# Patient Record
Sex: Female | Born: 1960 | Race: Asian | Hispanic: No | Marital: Married | Smoking: Never smoker
Health system: Southern US, Community
[De-identification: ages and names within clinical notes are randomized; demographics above are authoritative.]

## PROBLEM LIST (undated history)

## (undated) DIAGNOSIS — F329 Major depressive disorder, single episode, unspecified: Secondary | ICD-10-CM

## (undated) DIAGNOSIS — C801 Malignant (primary) neoplasm, unspecified: Secondary | ICD-10-CM

## (undated) DIAGNOSIS — G4730 Sleep apnea, unspecified: Secondary | ICD-10-CM

## (undated) DIAGNOSIS — Z923 Personal history of irradiation: Secondary | ICD-10-CM

## (undated) DIAGNOSIS — F32A Depression, unspecified: Secondary | ICD-10-CM

## (undated) DIAGNOSIS — G4700 Insomnia, unspecified: Secondary | ICD-10-CM

## (undated) DIAGNOSIS — F419 Anxiety disorder, unspecified: Secondary | ICD-10-CM

## (undated) HISTORY — PX: APPENDECTOMY: SHX54

## (undated) HISTORY — PX: MANDIBULECTOMY: SHX1999

---

## 2016-01-16 DIAGNOSIS — Z923 Personal history of irradiation: Secondary | ICD-10-CM

## 2016-01-16 HISTORY — DX: Personal history of irradiation: Z92.3

## 2016-08-20 ENCOUNTER — Other Ambulatory Visit: Payer: Self-pay | Admitting: Family Medicine

## 2016-08-20 DIAGNOSIS — Z1231 Encounter for screening mammogram for malignant neoplasm of breast: Secondary | ICD-10-CM

## 2016-08-30 ENCOUNTER — Ambulatory Visit
Admission: RE | Admit: 2016-08-30 | Discharge: 2016-08-30 | Disposition: A | Discharge status: Home / Self Care | Payer: BC Managed Care – PPO | Source: Ambulatory Visit | Attending: Family Medicine | Admitting: Family Medicine

## 2016-08-30 DIAGNOSIS — Z1231 Encounter for screening mammogram for malignant neoplasm of breast: Secondary | ICD-10-CM

## 2016-09-15 DIAGNOSIS — C801 Malignant (primary) neoplasm, unspecified: Secondary | ICD-10-CM

## 2016-09-15 HISTORY — DX: Malignant (primary) neoplasm, unspecified: C80.1

## 2016-09-19 ENCOUNTER — Other Ambulatory Visit: Payer: Self-pay | Admitting: Family Medicine

## 2016-09-19 DIAGNOSIS — R928 Other abnormal and inconclusive findings on diagnostic imaging of breast: Secondary | ICD-10-CM

## 2016-09-24 ENCOUNTER — Other Ambulatory Visit: Payer: Self-pay | Admitting: Family Medicine

## 2016-09-24 ENCOUNTER — Ambulatory Visit
Admission: RE | Admit: 2016-09-24 | Discharge: 2016-09-24 | Disposition: A | Discharge status: Home / Self Care | Payer: BC Managed Care – PPO | Source: Ambulatory Visit | Attending: Family Medicine | Admitting: Family Medicine

## 2016-09-24 DIAGNOSIS — N6320 Unspecified lump in the left breast, unspecified quadrant: Secondary | ICD-10-CM

## 2016-09-24 DIAGNOSIS — R928 Other abnormal and inconclusive findings on diagnostic imaging of breast: Secondary | ICD-10-CM

## 2016-09-25 ENCOUNTER — Other Ambulatory Visit: Payer: Self-pay | Admitting: Family Medicine

## 2016-09-25 DIAGNOSIS — N6320 Unspecified lump in the left breast, unspecified quadrant: Secondary | ICD-10-CM

## 2016-09-26 ENCOUNTER — Ambulatory Visit
Admission: RE | Admit: 2016-09-26 | Discharge: 2016-09-26 | Disposition: A | Discharge status: Home / Self Care | Payer: BC Managed Care – PPO | Source: Ambulatory Visit | Attending: Family Medicine | Admitting: Family Medicine

## 2016-09-26 DIAGNOSIS — N6320 Unspecified lump in the left breast, unspecified quadrant: Secondary | ICD-10-CM

## 2016-09-26 HISTORY — PX: BREAST BIOPSY: SHX20

## 2016-09-27 ENCOUNTER — Telehealth: Payer: Self-pay | Admitting: Hematology and Oncology

## 2016-09-27 ENCOUNTER — Encounter: Payer: Self-pay | Admitting: *Deleted

## 2016-09-27 NOTE — Telephone Encounter (Signed)
Confirmed BC appointment on 10/03/16 for the afternoon, will email intake form to [email or web address]

## 2016-10-02 ENCOUNTER — Other Ambulatory Visit: Payer: Self-pay

## 2016-10-02 ENCOUNTER — Other Ambulatory Visit: Payer: Self-pay | Admitting: *Deleted

## 2016-10-02 DIAGNOSIS — C50212 Malignant neoplasm of upper-inner quadrant of left female breast: Secondary | ICD-10-CM | POA: Insufficient documentation

## 2016-10-02 DIAGNOSIS — Z170 Estrogen receptor positive status [ER+]: Principal | ICD-10-CM

## 2016-10-03 ENCOUNTER — Ambulatory Visit (HOSPITAL_BASED_OUTPATIENT_CLINIC_OR_DEPARTMENT_OTHER): Payer: BC Managed Care – PPO | Admitting: Hematology and Oncology

## 2016-10-03 ENCOUNTER — Encounter: Payer: Self-pay | Admitting: Physical Therapy

## 2016-10-03 ENCOUNTER — Encounter: Payer: Self-pay | Admitting: Hematology and Oncology

## 2016-10-03 ENCOUNTER — Ambulatory Visit: Payer: BC Managed Care – PPO | Attending: General Surgery | Admitting: Physical Therapy

## 2016-10-03 ENCOUNTER — Ambulatory Visit
Admission: RE | Admit: 2016-10-03 | Discharge: 2016-10-03 | Disposition: A | Discharge status: Home / Self Care | Payer: BC Managed Care – PPO | Source: Ambulatory Visit | Attending: Radiation Oncology | Admitting: Radiation Oncology

## 2016-10-03 ENCOUNTER — Other Ambulatory Visit: Payer: Self-pay | Admitting: General Surgery

## 2016-10-03 ENCOUNTER — Encounter: Payer: Self-pay | Admitting: Radiation Oncology

## 2016-10-03 ENCOUNTER — Other Ambulatory Visit (HOSPITAL_BASED_OUTPATIENT_CLINIC_OR_DEPARTMENT_OTHER): Payer: BC Managed Care – PPO

## 2016-10-03 ENCOUNTER — Encounter: Payer: Self-pay | Admitting: General Practice

## 2016-10-03 DIAGNOSIS — C50212 Malignant neoplasm of upper-inner quadrant of left female breast: Secondary | ICD-10-CM | POA: Diagnosis not present

## 2016-10-03 DIAGNOSIS — R293 Abnormal posture: Secondary | ICD-10-CM | POA: Diagnosis present

## 2016-10-03 DIAGNOSIS — Z800 Family history of malignant neoplasm of digestive organs: Secondary | ICD-10-CM | POA: Diagnosis not present

## 2016-10-03 DIAGNOSIS — Z801 Family history of malignant neoplasm of trachea, bronchus and lung: Secondary | ICD-10-CM | POA: Insufficient documentation

## 2016-10-03 DIAGNOSIS — Z170 Estrogen receptor positive status [ER+]: Principal | ICD-10-CM

## 2016-10-03 DIAGNOSIS — Z808 Family history of malignant neoplasm of other organs or systems: Secondary | ICD-10-CM

## 2016-10-03 DIAGNOSIS — Z9889 Other specified postprocedural states: Secondary | ICD-10-CM | POA: Insufficient documentation

## 2016-10-03 DIAGNOSIS — G4700 Insomnia, unspecified: Secondary | ICD-10-CM | POA: Insufficient documentation

## 2016-10-03 DIAGNOSIS — Z510 Encounter for antineoplastic radiation therapy: Secondary | ICD-10-CM | POA: Insufficient documentation

## 2016-10-03 DIAGNOSIS — Z79899 Other long term (current) drug therapy: Secondary | ICD-10-CM | POA: Insufficient documentation

## 2016-10-03 HISTORY — DX: Insomnia, unspecified: G47.00

## 2016-10-03 LAB — CBC WITH DIFFERENTIAL/PLATELET
BASO%: 0.8 % (ref 0.0–2.0)
BASOS ABS: 0.1 10*3/uL (ref 0.0–0.1)
EOS%: 5.7 % (ref 0.0–7.0)
Eosinophils Absolute: 0.4 10*3/uL (ref 0.0–0.5)
HCT: 42.4 % (ref 34.8–46.6)
HEMOGLOBIN: 13.6 g/dL (ref 11.6–15.9)
LYMPH#: 1.9 10*3/uL (ref 0.9–3.3)
LYMPH%: 29.6 % (ref 14.0–49.7)
MCH: 27.4 pg (ref 25.1–34.0)
MCHC: 32.1 g/dL (ref 31.5–36.0)
MCV: 85.5 fL (ref 79.5–101.0)
MONO#: 0.5 10*3/uL (ref 0.1–0.9)
MONO%: 8.1 % (ref 0.0–14.0)
NEUT#: 3.5 10*3/uL (ref 1.5–6.5)
NEUT%: 55.8 % (ref 38.4–76.8)
PLATELETS: 220 10*3/uL (ref 145–400)
RBC: 4.96 10*6/uL (ref 3.70–5.45)
RDW: 13.5 % (ref 11.2–14.5)
WBC: 6.3 10*3/uL (ref 3.9–10.3)

## 2016-10-03 LAB — COMPREHENSIVE METABOLIC PANEL
ALBUMIN: 4.1 g/dL (ref 3.5–5.0)
ALK PHOS: 94 U/L (ref 40–150)
ALT: 18 U/L (ref 0–55)
ANION GAP: 8 meq/L (ref 3–11)
AST: 25 U/L (ref 5–34)
BUN: 13.6 mg/dL (ref 7.0–26.0)
CALCIUM: 9.6 mg/dL (ref 8.4–10.4)
CHLORIDE: 104 meq/L (ref 98–109)
CO2: 28 mEq/L (ref 22–29)
CREATININE: 0.7 mg/dL (ref 0.6–1.1)
EGFR: >90 mL/min/{1.73_m2} (ref >90)
Glucose: 77 mg/dl (ref 70–140)
POTASSIUM: 4 meq/L (ref 3.5–5.1)
Sodium: 141 mEq/L (ref 136–145)
Total Bilirubin: 0.36 mg/dL (ref 0.20–1.20)
Total Protein: 7.6 g/dL (ref 6.4–8.3)

## 2016-10-03 NOTE — Progress Notes (Signed)
Radiation Oncology         [phone]) [id number] ________________________________  Name: Grace Cunningham        MRN: [id number]  Date of Service: 10/03/2016 DOB: 1960-05-28  [id number], Bill Salinas, MD  [name], MD     REFERRING PHYSICIAN: [name], MD   DIAGNOSIS: The encounter diagnosis was Malignant neoplasm of upper-inner quadrant of left breast in female, estrogen receptor positive ([place]).   HISTORY OF PRESENT ILLNESS: Grace Cunningham is a 56 y.o. female seen in the multidisciplinary breast clinic for a new diagnosis of left breast cancer. The patient was noted to have a screening distortion on mammography of the left breast. She underwent diagnostic imaging on 09/24/16 which revealed a 1.2 x 1 x  .6 cm at 9:30. The axilla is negative for adenopathy. 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 She comes today to discuss options of radiotherapy.   PREVIOUS RADIATION THERAPY: No   PAST MEDICAL HISTORY:  Past Medical History:  Diagnosis Date  . Insomnia        PAST SURGICAL HISTORY: Past Surgical History:  Procedure Laterality Date  . APPENDECTOMY    . MANDIBULECTOMY       FAMILY HISTORY:  Family History  Problem Relation Age of Onset  . Lung cancer Mother   . Skin cancer Maternal Grandmother   . Stomach cancer Maternal Grandfather   . Lung cancer Maternal Uncle      SOCIAL HISTORY:  reports that she has never smoked. She has never used smokeless tobacco. She reports that she does not drink alcohol or use drugs. The patient is married and lives in [place]. 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   ALLERGIES: Patient has no known allergies.   MEDICATIONS:  Current Outpatient Prescriptions  Medication Sig Dispense Refill  . Melatonin 1 MG CAPS Take 1 capsule by mouth 3 (three) times a week.    . Multiple  Vitamin (MULTIVITAMIN) tablet Take 1 tablet by mouth as needed.    . sertraline (ZOLOFT) 50 MG tablet Take 50 mg by mouth daily.     No current facility-administered medications for this encounter.      REVIEW OF SYSTEMS: On review of systems, the patient reports that she is doing well overall. She denies any chest pain, shortness of breath, cough, fevers, chills, night sweats, unintended weight changes. She denies any bowel or bladder disturbances, and denies abdominal pain, nausea or vomiting. She denies any new musculoskeletal or joint aches or pains. A complete review of systems is obtained and is otherwise negative.     PHYSICAL EXAM:  Wt Readings from Last 3 Encounters:  10/03/16 132 lb 8 oz (60.1 kg)   Temp Readings from Last 3 Encounters:  10/03/16 98.2 F (36.8 C) (Oral)   BP Readings from Last 3 Encounters:  10/03/16 122/81   Pulse Readings from Last 3 Encounters:  10/03/16 (!) 59    In general this is a well appearing Asian female in no acute distress. She is alert and oriented x4 and appropriate throughout the examination. HEENT reveals that the patient is normocephalic, atraumatic. EOMs are intact. PERRLA. Skin is intact without any evidence of gross lesions. Cardiovascular exam reveals a regular rate and rhythm, no clicks rubs or murmurs are auscultated. Chest is clear to auscultation bilaterally. Lymphatic assessment is performed  and does not reveal any adenopathy in the cervical, supraclavicular, axillary, or inguinal chains. Bilateral breast exam is performed and reveals mild ecchymosis. She does not have any palpable mass in the breast otherwise or left breast. She does not have any nipple bleeding or discharge. Abdomen has active bowel sounds in all quadrants and is intact. The abdomen is soft, non tender, non distended. Lower extremities are negative for pretibial pitting edema, deep calf tenderness, cyanosis or clubbing.   ECOG = 0  0 - Asymptomatic (Fully active,  able to carry on all predisease activities without restriction)  1 - Symptomatic but completely ambulatory (Restricted in physically strenuous activity but ambulatory and able to carry out work of a light or sedentary nature. For example, light housework, office work)  2 - Symptomatic, <50% in bed during the day (Ambulatory and capable of all self care but unable to carry out any work activities. Up and about more than 50% of waking hours)  3 - Symptomatic, >50% in bed, but not bedbound (Capable of only limited self-care, confined to bed or chair 50% or more of waking hours)  4 - Bedbound (Completely disabled. Cannot carry on any self-care. Totally confined to bed or chair)  5 - Death   Santiago Glad MM, Creech RH, Tormey DC, et al. [phone]). "Toxicity and response criteria of the [facility]". Am. Evlyn Clines. Oncol. 5 (6): 649-55    LABORATORY DATA:  Lab Results  Component Value Date   WBC 6.3 10/03/2016   HGB 13.6 10/03/2016   HCT 42.4 10/03/2016   MCV 85.5 10/03/2016   PLT 220 10/03/2016   Lab Results  Component Value Date   NA 141 10/03/2016   K 4.0 10/03/2016   CO2 28 10/03/2016   Lab Results  Component Value Date   ALT 18 10/03/2016   AST 25 10/03/2016   ALKPHOS 94 10/03/2016   BILITOT 0.36 10/03/2016      RADIOGRAPHY: US Breast Ltd Uni Left Inc Axilla  Result Date: 09/24/2016 CLINICAL DATA:  The patient was called back from screening mammography due to left breast distortion. EXAM: 2D DIGITAL DIAGNOSTIC LEFT MAMMOGRAM WITH ADJUNCT TOMO ULTRASOUND LEFT BREAST COMPARISON:  Previous exam(s). ACR Breast Density Category c: The breast tissue is heterogeneously dense, which may obscure small masses. FINDINGS: There is distortion in the left retroareolar region which persists on today's imaging. Calcifications in the left breast are stable for many years. On physical exam, no suspicious lumps are identified. Targeted ultrasound is performed, showing a suspicious  irregular mass at 9:30 in the retroareolar region measuring 6 x 12 x 10 mm. No axillary adenopathy. IMPRESSION: Suspicious mass in the left breast. RECOMMENDATION: Recommend ultrasound-guided biopsy of the suspicious left breast mass. If the mass represents malignancy, consider breast MRI to assess extent of disease. I have discussed the findings and recommendations with the patient. Results were also provided in writing at the conclusion of the visit. If applicable, a reminder letter will be sent to the patient regarding the next appointment. BI-RADS CATEGORY  5: Highly suggestive of malignancy. Electronically Signed   By: Gerome Sam III M.D   On: 09/24/2016 16:27   Mm Diag Breast Tomo Uni Left  Result Date: 09/24/2016 CLINICAL DATA:  The patient was called back from screening mammography due to left breast distortion. EXAM: 2D DIGITAL DIAGNOSTIC LEFT MAMMOGRAM WITH ADJUNCT TOMO ULTRASOUND LEFT BREAST COMPARISON:  Previous exam(s). ACR Breast Density Category c: The breast tissue is heterogeneously dense, which may obscure  small masses. FINDINGS: There is distortion in the left retroareolar region which persists on today's imaging. Calcifications in the left breast are stable for many years. On physical exam, no suspicious lumps are identified. Targeted ultrasound is performed, showing a suspicious irregular mass at 9:30 in the retroareolar region measuring 6 x 12 x 10 mm. No axillary adenopathy. IMPRESSION: Suspicious mass in the left breast. RECOMMENDATION: Recommend ultrasound-guided biopsy of the suspicious left breast mass. If the mass represents malignancy, consider breast MRI to assess extent of disease. I have discussed the findings and recommendations with the patient. Results were also provided in writing at the conclusion of the visit. If applicable, a reminder letter will be sent to the patient regarding the next appointment. BI-RADS CATEGORY  5: Highly suggestive of malignancy. Electronically  Signed   By: Dorise Bullion III M.D   On: 09/24/2016 16:27   Mm Clip Placement Left  Result Date: 09/26/2016 CLINICAL DATA:  56 year old female status post ultrasound-guided biopsy of a left breast mass. EXAM: DIAGNOSTIC LEFT MAMMOGRAM POST ULTRASOUND BIOPSY COMPARISON:  Previous exam(s). FINDINGS: Mammographic images were obtained following ultrasound guided biopsy of a left subareolar mass. Post clip films demonstrate a ribbon shaped clip in the expected location. IMPRESSION: Ribbon shaped clip in the expected location status post left subareolar ultrasound-guided biopsy. Final Assessment: Post Procedure Mammograms for Marker Placement Electronically Signed   By: Kristopher Oppenheim M.D.   On: 09/26/2016 09:33   [place] Lt Breast Bx W Loc Dev 1st Lesion Img Bx Spec US Guide  Addendum Date: 09/27/2016   ADDENDUM REPORT: 09/27/2016 15:45 ADDENDUM: Pathology revealed GRADE I INVASIVE DUCTAL CARCINOMA, DUCTAL CARCINOMA IN SITU of the Left breast, 9:30 o'clock subareolar. This was found to be concordant by Dr. Kristopher Oppenheim. Pathology results were discussed with the patient by telephone. The patient reported doing well after the biopsy with tenderness at the site. Post biopsy instructions and care were reviewed and questions were answered. The patient was encouraged to call [facility] for any additional concerns. The patient was referred to [facility] on October 03, 2016. Recommendation for bilateral breast MRI due to breast density and to evaluate extent of disease. Pathology results reported by Terie Purser, RN on 09/27/2016. Electronically Signed   By: Kristopher Oppenheim M.D.   On: 09/27/2016 15:45   Result Date: 09/27/2016 CLINICAL DATA:  56 year old female with suspicious left breast mass. EXAM: ULTRASOUND GUIDED LEFT BREAST CORE NEEDLE BIOPSY COMPARISON:  Previous exam(s). FINDINGS: I met with the patient and  we discussed the procedure of ultrasound-guided biopsy, including benefits and alternatives. We discussed the high likelihood of a successful procedure. We discussed the risks of the procedure, including infection, bleeding, tissue injury, clip migration, and inadequate sampling. Informed written consent was given. The usual time-out protocol was performed immediately prior to the procedure. Lesion quadrant: Upper-outer quadrant. Using sterile technique and 1% Lidocaine as local anesthetic, under direct ultrasound visualization, a 14 gauge spring-loaded device was used to perform biopsy of a left breast mass using a inferior approach. At the conclusion of the procedure a ribbon shaped tissue marker clip was deployed into the biopsy cavity. Follow up 2 view mammogram was performed and dictated separately. IMPRESSION: Ultrasound guided biopsy of a left breast mass. No apparent complications. Electronically Signed: By: Kristopher Oppenheim M.D. On: 09/26/2016 09:32    IMPRESSION/PLAN: 1. 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 Dr. Lisbeth Renshaw discusses the pathology findings and reviews the nature of invasive breast disease. The consensus from the breast conference include breast conservation with lumpectomy with sentinel mapping. Oncotype testing is anticipated to determine a role for chemotherapy. Provided that chemotherapy is not indicated, the patient's course would then be followed by external radiotherapy to the breast followed by antiestrogen therapy. We discussed the risks, benefits, short, and long term effects of radiotherapy, and the patient is interested in proceeding. Dr. Lisbeth Renshaw discusses the delivery and logistics of radiotherapy and would anticipate a course of 4- 6 1/2 weeks of treatment with deep inspiration breath hold technique, though leaning toward 6 1/2 weeks. We will see her back about 2 weeks after surgery to move forward with the simulation and  planning process and anticipate starting radiotherapy about 4 weeks after surgery.    The above documentation reflects my direct findings during this shared patient visit. Please see the separate note by Dr. Lisbeth Renshaw on this date for the remainder of the patient's plan of care.    Carola Rhine, PAC

## 2016-10-03 NOTE — Therapy (Signed)
[place], [place], [address] Phone: [phone]   Fax:  [phone]  Physical Therapy Evaluation  Patient Details  Name: Grace Cunningham MRN: [id number] Date of Birth: 1960/12/11 Referring Provider: Dr. [name]  Encounter Date: 10/03/2016      PT End of Session - 10/03/16 2020    Visit Number 1   Number of Visits 2   Date for PT Re-Evaluation --  Will reassess 3 weeks post op   PT Start Time 1558   PT Stop Time 1620   PT Time Calculation (min) 22 min   Activity Tolerance Patient tolerated treatment well   Behavior During Therapy [facility] for tasks assessed/performed      Past Medical History:  Diagnosis Date  . Insomnia     Past Surgical History:  Procedure Laterality Date  . APPENDECTOMY    . MANDIBULECTOMY      There were no vitals filed for this visit.       Subjective Assessment - 10/03/16 2013    Subjective Patient reports she is here today to be seen by her medical team for her newly diagnosed left breast cancer.   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 It measures 1.2 cm and is located in the upper inner quadrant with a Ki67 of 3%. She has no other health problems.   Patient Stated Goals Reduce lymphedema risk and learn post op shoulder ROM HEP   Currently in Pain? No/denies            [facility] PT Assessment - 10/03/16 0001      Assessment   Medical Diagnosis Left breast cancer   Referring Provider Dr. [name]   Onset Date/Surgical Date 08/30/16   Hand Dominance Right   Prior Therapy none     Precautions   Precautions Other (comment)   Precaution Comments active breast cancer     Restrictions   Weight Bearing Restrictions No     Balance Screen   Has the patient fallen in the past 6 months No   Has the patient had a decrease in  activity level because of a fear of falling?  No   Is the patient reluctant to leave their home because of a fear of falling?  No     Home Ecologist residence   Living Arrangements Spouse/significant other   Available Help at Discharge Family     Prior Function   Level of Independence Independent   Vocation Full time employment   Vocation Requirements teaches nursing at [employer] She walks daily > 30 min and swims 1-2x/week for > 30 min     Cognition   Overall Cognitive Status Within Functional Limits for tasks assessed     Posture/Postural Control   Posture/Postural Control Postural limitations   Postural Limitations Rounded Shoulders     ROM / Strength   AROM / PROM / Strength AROM;Strength     AROM   AROM Assessment Site Shoulder;Cervical   Right/Left Shoulder Right;Left   Right Shoulder Extension 60 Degrees   Right Shoulder Flexion 150 Degrees   Right Shoulder ABduction 174 Degrees   Right Shoulder Internal Rotation 80 Degrees   Right Shoulder External Rotation 85 Degrees   Left Shoulder Extension 60 Degrees   Left Shoulder Flexion 154 Degrees   Left Shoulder ABduction 164  Degrees   Left Shoulder Internal Rotation 79 Degrees   Left Shoulder External Rotation 90 Degrees   Cervical Flexion WNL   Cervical Extension WNL   Cervical - Right Side Bend 25% limited   Cervical - Left Side Bend 25% limited   Cervical - Right Rotation WNL   Cervical - Left Rotation WNL     Strength   Overall Strength Within functional limits for tasks performed           LYMPHEDEMA/ONCOLOGY QUESTIONNAIRE - 10/03/16 2019      Type   Cancer Type Left breast cancer     Lymphedema Assessments   Lymphedema Assessments Upper extremities     Right Upper Extremity Lymphedema   10 cm Proximal to Olecranon Process 27.3 cm   Olecranon Process 22.2 cm   10 cm Proximal to Ulnar Styloid Process 18.6 cm   Just Proximal to Ulnar Styloid Process 13.5 cm    Across Hand at [facility] 18 cm   At [place] of 2nd Digit 5.9 cm     Left Upper Extremity Lymphedema   10 cm Proximal to Olecranon Process 25.8 cm   Olecranon Process 22.3 cm   10 cm Proximal to Ulnar Styloid Process 18.9 cm   Just Proximal to Ulnar Styloid Process 13.1 cm   Across Hand at [facility] 17.7 cm   At [place] of 2nd Digit 5.4 cm         Objective measurements completed on examination: See above findings.     Patient was instructed today in a home exercise program today for post op shoulder range of motion. These included active assist shoulder flexion in sitting, scapular retraction, wall walking with shoulder abduction, and hands behind head external rotation.  She was encouraged to do these twice a day, holding 3 seconds and repeating 5 times when permitted by her physician.         PT Education - 10/03/16 2020    Education provided Yes   Education Details Lymphedema risk reduction and post op shoulder ROM HEP   Person(s) Educated Patient;Spouse   Methods Explanation;Demonstration;Handout   Comprehension Verbalized understanding;Returned demonstration              Breast Clinic Goals - 10/03/16 2024      Patient will be able to verbalize understanding of pertinent lymphedema risk reduction practices relevant to her diagnosis specifically related to skin care.   Time 1   Period Days   Status Achieved     Patient will be able to return demonstrate and/or verbalize understanding of the post-op home exercise program related to regaining shoulder range of motion.   Time 1   Period Days   Status Achieved     Patient will be able to verbalize understanding of the importance of attending the postoperative After Breast Cancer Class for further lymphedema risk reduction education and therapeutic exercise.   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 It measures 1.2 cm and is located in the upper inner quadrant with a Ki67 of 3%. She has no other health problems. Her multidisciplinary medical team met prior to her assessments to determine a recommended treatment plan. She will benefit from post op PT to regain shoulder ROM  and reduce lymphedema risk.   History and Personal Factors relevant to plan of care: None   Clinical Presentation Stable   Clinical Presentation due to: Low risk breast cancer; otherwise healthy   Clinical Decision Making Low   Rehab Potential Excellent   Clinical Impairments Affecting Rehab Potential None   PT Frequency --  2 visits   PT Treatment/Interventions Therapeutic exercise;Patient/family education   PT Next Visit Plan Will f/u 3 weeks post op to determine PT needs and reassess   PT Home Exercise Plan Post op shoulder ROM HEP   Consulted and Agree with Plan of Care Patient;Family member/caregiver   Family Member Consulted Husband      Patient will benefit from skilled therapeutic intervention in order to improve the following deficits and impairments:  Postural dysfunction, Decreased knowledge of precautions, Pain, Impaired UE functional use, Decreased range of motion  Visit Diagnosis: Carcinoma of upper-inner quadrant of left breast in female, estrogen receptor positive ([place]) - Plan: PT plan of care cert/re-cert  Abnormal posture - Plan: PT plan of care cert/re-cert   Patient will follow up at outpatient cancer rehab if needed following surgery.  If the patient requires physical therapy at that time, a specific plan will be dictated and sent to the referring physician for approval. The patient was educated today on appropriate basic range of motion exercises to begin post operatively and the importance of attending the After Breast Cancer class following surgery.  Patient was educated today on lymphedema risk reduction practices as  it pertains to recommendations that will benefit the patient immediately following surgery.  She verbalized good understanding.  No additional physical therapy is indicated at this time.     Problem List Patient Active Problem List   Diagnosis Date Noted  . Malignant neoplasm of upper-inner quadrant of left breast in female, estrogen receptor positive ([place]) 10/02/2016   Annia Friendly, PT 10/03/16 8:27 PM  [place], [place], [address] Phone: [phone]   Fax:  [phone]  Name: Grace Cunningham MRN: [id number] Date of Birth: [birth year]

## 2016-10-03 NOTE — Progress Notes (Signed)
Nutrition Assessment  Reason for Assessment:  Pt seen in Breast Clinic  ASSESSMENT:   56 year old female with new diagnosis of breast cancer.  Past medical history of reviewed  Patient reports normal appetite  Medications:  reviewed  Labs: reviewed  Anthropometrics:   Height: 64 inches Weight: 132 lb BMI: 22   NUTRITION DIAGNOSIS: Food and nutrition related knowledge deficit related to new diagnosis of breast cancer as evidenced by no prior need for nutrition related information.  INTERVENTION:   Discussed and provided packet of information regarding nutritional tips for breast cancer patients.  Questions answered.  Teachback method used.  Contact information provided and patient knows to contact me with questions/concerns.    MONITORING, EVALUATION, and GOAL: Pt will consume a healthy plant based diet to maintain lean body mass throughout treatment.   Grace Cunningham, [place], [place] Registered Dietitian [phone] (pager)

## 2016-10-03 NOTE — Patient Instructions (Signed)

## 2016-10-04 ENCOUNTER — Other Ambulatory Visit: Payer: Self-pay | Admitting: General Surgery

## 2016-10-04 ENCOUNTER — Encounter: Payer: Self-pay | Admitting: Hematology and Oncology

## 2016-10-04 DIAGNOSIS — C50212 Malignant neoplasm of upper-inner quadrant of left female breast: Secondary | ICD-10-CM

## 2016-10-04 DIAGNOSIS — Z170 Estrogen receptor positive status [ER+]: Principal | ICD-10-CM

## 2016-10-04 NOTE — Progress Notes (Signed)
[place] Psychosocial Distress Screening Spiritual Care  Met with Grace Cunningham and husband Grace Cunningham in [facility] to introduce [facility] team/resources, reviewing distress screen per protocol.  The patient scored a 1 on the Psychosocial Distress Thermometer which indicates mild distress. Also assessed for distress and other psychosocial needs.   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  She and her husband have deep respect and reverence for one another, which is a huge source of strength and meaning for them.  [name] looks at her dx as an opportunity to develop more empathy and understanding, reporting very little distress.  They utilized our encounter for spiritual reflection and request further Spiritual Care.   Follow up needed: Yes.  We plan to keep in touch by phone/in person when the [name] are on campus.   [place], [place], [facility] Pager [phone] Voicemail [phone]

## 2016-10-04 NOTE — Assessment & Plan Note (Signed)
09/26/2016: Screening detected distortion in the left breast retroareolar region at 9:30 position 1.2 cm, axilla negative. 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 After reviewing the pathology in detail, we proceeded to discuss the different treatment options between surgery, radiation, chemotherapy, antiestrogen therapies.  Recommendations: 1. Breast conserving surgery with SLN biopsy followed by 2. Oncotype DX testing to determine if chemotherapy would be of any benefit followed by 3. Adjuvant radiation therapy followed by 4. Adjuvant antiestrogen therapy  Oncotype counseling: I discussed Oncotype DX test. I explained to the patient that this is a 21 gene panel to evaluate patient tumors DNA to calculate recurrence score. This would help determine whether patient has high risk or intermediate risk or low risk breast cancer. She understands that if her tumor was found to be high risk, she would benefit from systemic chemotherapy. If low risk, no need of chemotherapy. If she was found to be intermediate risk, we would need to evaluate the score as well as other risk factors and determine if an abbreviated chemotherapy may be of benefit.  Return to clinic after surgery to discuss final pathology report and then determine if Oncotype DX testing will need to be sent.

## 2016-10-04 NOTE — Progress Notes (Signed)
[facility] CONSULT NOTE  Patient Care Team: Rankins, Fanny Dance, MD as PCP - General (Family Medicine) Almond Lint, MD as Consulting Physician (General Surgery) Serena Croissant, MD as Consulting Physician (Hematology and Oncology) Dorothy Puffer, MD as Consulting Physician (Radiation Oncology)  CHIEF COMPLAINTS/PURPOSE OF CONSULTATION:  Newly diagnosed breast cancer  HISTORY OF PRESENTING ILLNESS:  Grace Cunningham 56 y.o. female is here because of recent diagnosis of left breast cancer. Patient had a screening mammogram that detected distortion in the left breast in the retroareolar region at 9:30 position measuring 1.2 cm in size. 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 She was presented this morning in the multidisciplinary tumor board and she is here today accompanied by her husband to discuss the treatment plan. Patient works as a Airline pilot at [employer] originally was from [place].  I reviewed her records extensively and collaborated the history with the patient.  SUMMARY OF ONCOLOGIC HISTORY:   Malignant neoplasm of upper-inner quadrant of left breast in female, estrogen receptor positive ([facility]CC)   09/26/2016 Initial Diagnosis    Screening detected distortion in the left breast retroareolar region at 9:30 position 1.2 cm, axilla negative. 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 Insomnia     SURGICAL HISTORY: Past Surgical History:  Procedure Laterality Date  . APPENDECTOMY    . MANDIBULECTOMY      SOCIAL HISTORY: Social History   Social History  . Marital status: Unknown    Spouse name: N/A  . Number of children: N/A  . Years of education: N/A   Occupational History  . Not on file.    Social History Main Topics  . Smoking status: Never Smoker  . Smokeless tobacco: Never Used  . Alcohol use No  . Drug use: No  . Sexual activity: Not on file   Other Topics Concern  . Not on file   Social History Narrative  . No narrative on file    FAMILY HISTORY: Family History  Problem Relation Age of Onset  . Lung cancer Mother   . Skin cancer Maternal Grandmother   . Stomach cancer Maternal Grandfather   . Lung cancer Maternal Uncle     ALLERGIES:  has No Known Allergies.  MEDICATIONS:  Current Outpatient Prescriptions  Medication Sig Dispense Refill  . Melatonin 1 MG CAPS Take 1 capsule by mouth 3 (three) times a week.    . Multiple Vitamin (MULTIVITAMIN) tablet Take 1 tablet by mouth as needed.    . sertraline (ZOLOFT) 50 MG tablet Take 50 mg by mouth daily.     No current facility-administered medications for this visit.     REVIEW OF SYSTEMS:   Constitutional: Denies fevers, chills or abnormal night sweats Eyes: Denies blurriness of vision, double vision or watery eyes Ears, nose, mouth, throat, and face: Denies mucositis or sore throat Respiratory: Denies cough, dyspnea or wheezes Cardiovascular: Denies palpitation, chest discomfort or lower extremity swelling Gastrointestinal:  Denies nausea, heartburn or change in bowel habits Skin: Denies abnormal skin rashes Lymphatics: Denies new lymphadenopathy or easy bruising Neurological:Denies numbness, tingling or new weaknesses Behavioral/Psych: Mood is stable, no new changes  Breast:  Denies any palpable lumps or discharge All other  systems were reviewed with the patient and are negative.  PHYSICAL EXAMINATION: ECOG PERFORMANCE STATUS: 0 - Asymptomatic  Vitals:   10/03/16 1314  BP: 122/81  Pulse: (!) 59  Resp: 18  Temp: 98.2 F (36.8 C)  SpO2: 98%   Filed Weights   10/03/16 1314  Weight: 132 lb 8 oz (60.1 kg)    GENERAL:alert, no distress and comfortable SKIN: skin color, texture, turgor  are normal, no rashes or significant lesions EYES: normal, conjunctiva are pink and non-injected, sclera clear OROPHARYNX:no exudate, no erythema and lips, buccal mucosa, and tongue normal  NECK: supple, thyroid normal size, non-tender, without nodularity LYMPH:  no palpable lymphadenopathy in the cervical, axillary or inguinal LUNGS: clear to auscultation and percussion with normal breathing effort HEART: regular rate & rhythm and no murmurs and no lower extremity edema ABDOMEN:abdomen soft, non-tender and normal bowel sounds Musculoskeletal:no cyanosis of digits and no clubbing  PSYCH: alert & oriented x 3 with fluent speech NEURO: no focal motor/sensory deficits BREAST: No palpable nodules in breast. No palpable axillary or supraclavicular lymphadenopathy (exam performed in the presence of a chaperone)   LABORATORY DATA:  I have reviewed the data as listed Lab Results  Component Value Date   WBC 6.3 10/03/2016   HGB 13.6 10/03/2016   HCT 42.4 10/03/2016   MCV 85.5 10/03/2016   PLT 220 10/03/2016   Lab Results  Component Value Date   NA 141 10/03/2016   K 4.0 10/03/2016   CO2 28 10/03/2016    RADIOGRAPHIC STUDIES: I have personally reviewed the radiological reports and agreed with the findings in the report.  ASSESSMENT AND PLAN:  Malignant neoplasm of upper-inner quadrant of left breast in female, estrogen receptor positive ([place]) 09/26/2016: Screening detected distortion in the left breast retroareolar region at 9:30 position 1.2 cm, axilla negative. 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 After reviewing the pathology in detail, we proceeded to discuss the different treatment options between surgery, radiation, chemotherapy, antiestrogen therapies.  Recommendations: 1. Breast conserving surgery with SLN biopsy followed by 2. Oncotype DX testing to determine if chemotherapy would be of any benefit followed by 3. Adjuvant radiation therapy followed by 4. Adjuvant antiestrogen therapy  Oncotype counseling: I discussed Oncotype DX test. I explained to the patient that this is a 21 gene panel to evaluate patient tumors DNA to calculate recurrence score. This would help determine whether patient has high risk or intermediate risk or low risk breast cancer. She understands that if her tumor was found to be high risk, she would benefit from systemic chemotherapy. If low risk, no need of chemotherapy. If she was found to be intermediate risk, we would need to evaluate the score as well as other risk factors and determine if an abbreviated chemotherapy may be of benefit.  Return to clinic after surgery to discuss final pathology report and then determine if Oncotype DX testing will need to be sent.  All questions were answered. The patient knows to call the clinic with any problems, questions or concerns.    Rulon Eisenmenger, MD 10/04/16

## 2016-10-05 ENCOUNTER — Encounter: Payer: Self-pay | Admitting: Hematology and Oncology

## 2016-10-08 ENCOUNTER — Telehealth: Payer: Self-pay | Admitting: Hematology and Oncology

## 2016-10-08 NOTE — Telephone Encounter (Signed)
This number is not in service. Scheduled appts per 9/21 sch msg. Sending her a confirmation letter

## 2016-10-09 ENCOUNTER — Encounter (HOSPITAL_BASED_OUTPATIENT_CLINIC_OR_DEPARTMENT_OTHER): Payer: Self-pay | Admitting: *Deleted

## 2016-10-09 ENCOUNTER — Telehealth: Payer: Self-pay | Admitting: *Deleted

## 2016-10-09 NOTE — Telephone Encounter (Signed)
Spoke with pt regarding. [facility] from 9.19.18. Denies questions or concerns regarding dx or treatment care plan. Encourage pt to call with needs. Received verbal understanding.

## 2016-10-11 ENCOUNTER — Ambulatory Visit
Admission: RE | Admit: 2016-10-11 | Discharge: 2016-10-11 | Disposition: A | Discharge status: Home / Self Care | Payer: BC Managed Care – PPO | Source: Ambulatory Visit | Attending: General Surgery | Admitting: General Surgery

## 2016-10-11 DIAGNOSIS — C50212 Malignant neoplasm of upper-inner quadrant of left female breast: Secondary | ICD-10-CM

## 2016-10-11 DIAGNOSIS — Z170 Estrogen receptor positive status [ER+]: Principal | ICD-10-CM

## 2016-10-11 NOTE — Progress Notes (Signed)
Pt given Ensure drink and instructed to drink by 0400 day of surgery with teach back method.

## 2016-10-15 ENCOUNTER — Encounter (HOSPITAL_BASED_OUTPATIENT_CLINIC_OR_DEPARTMENT_OTHER)
Admission: RE | Disposition: A | Discharge status: Home / Self Care | Payer: Self-pay | Source: Ambulatory Visit | Attending: General Surgery

## 2016-10-15 ENCOUNTER — Ambulatory Visit (HOSPITAL_BASED_OUTPATIENT_CLINIC_OR_DEPARTMENT_OTHER): Payer: BC Managed Care – PPO | Admitting: Anesthesiology

## 2016-10-15 ENCOUNTER — Encounter (HOSPITAL_BASED_OUTPATIENT_CLINIC_OR_DEPARTMENT_OTHER): Payer: Self-pay

## 2016-10-15 ENCOUNTER — Ambulatory Visit
Admission: RE | Admit: 2016-10-15 | Discharge: 2016-10-15 | Disposition: A | Discharge status: Home / Self Care | Payer: BC Managed Care – PPO | Source: Ambulatory Visit | Attending: General Surgery | Admitting: General Surgery

## 2016-10-15 ENCOUNTER — Ambulatory Visit (HOSPITAL_COMMUNITY)
Admission: RE | Admit: 2016-10-15 | Discharge: 2016-10-15 | Disposition: A | Discharge status: Home / Self Care | Payer: BC Managed Care – PPO | Source: Ambulatory Visit | Attending: General Surgery | Admitting: General Surgery

## 2016-10-15 ENCOUNTER — Ambulatory Visit (HOSPITAL_BASED_OUTPATIENT_CLINIC_OR_DEPARTMENT_OTHER)
Admission: RE | Admit: 2016-10-15 | Discharge: 2016-10-15 | Disposition: A | Discharge status: Home / Self Care | Payer: BC Managed Care – PPO | Source: Ambulatory Visit | Attending: General Surgery | Admitting: General Surgery

## 2016-10-15 DIAGNOSIS — F329 Major depressive disorder, single episode, unspecified: Secondary | ICD-10-CM | POA: Diagnosis not present

## 2016-10-15 DIAGNOSIS — Z8249 Family history of ischemic heart disease and other diseases of the circulatory system: Secondary | ICD-10-CM | POA: Diagnosis not present

## 2016-10-15 DIAGNOSIS — I10 Essential (primary) hypertension: Secondary | ICD-10-CM | POA: Diagnosis not present

## 2016-10-15 DIAGNOSIS — Z170 Estrogen receptor positive status [ER+]: Secondary | ICD-10-CM | POA: Diagnosis not present

## 2016-10-15 DIAGNOSIS — C50212 Malignant neoplasm of upper-inner quadrant of left female breast: Secondary | ICD-10-CM

## 2016-10-15 DIAGNOSIS — Z79899 Other long term (current) drug therapy: Secondary | ICD-10-CM | POA: Diagnosis not present

## 2016-10-15 DIAGNOSIS — G4730 Sleep apnea, unspecified: Secondary | ICD-10-CM | POA: Insufficient documentation

## 2016-10-15 HISTORY — DX: Anxiety disorder, unspecified: F41.9

## 2016-10-15 HISTORY — DX: Major depressive disorder, single episode, unspecified: F32.9

## 2016-10-15 HISTORY — DX: Depression, unspecified: F32.A

## 2016-10-15 HISTORY — DX: Malignant (primary) neoplasm, unspecified: C80.1

## 2016-10-15 HISTORY — DX: Sleep apnea, unspecified: G47.30

## 2016-10-15 HISTORY — PX: BREAST LUMPECTOMY WITH RADIOACTIVE SEED AND SENTINEL LYMPH NODE BIOPSY: SHX6550

## 2016-10-15 HISTORY — PX: BREAST LUMPECTOMY: SHX2

## 2016-10-15 SURGERY — BREAST LUMPECTOMY WITH RADIOACTIVE SEED AND SENTINEL LYMPH NODE BIOPSY
Anesthesia: General | Site: Breast | Laterality: Left

## 2016-10-15 MED ORDER — EPHEDRINE 5 MG/ML INJ
INTRAVENOUS | Status: AC
  Filled 2016-10-15: qty 10

## 2016-10-15 MED ORDER — 0.9 % SODIUM CHLORIDE (POUR BTL) OPTIME
TOPICAL | Status: DC | PRN
  Administered 2016-10-15: 200 mL

## 2016-10-15 MED ORDER — BUPIVACAINE HCL (PF) 0.25 % IJ SOLN
INTRAMUSCULAR | Status: AC
  Filled 2016-10-15: qty 30

## 2016-10-15 MED ORDER — LACTATED RINGERS IV SOLN
INTRAVENOUS | Status: DC
  Administered 2016-10-15 (×2): via INTRAVENOUS

## 2016-10-15 MED ORDER — OXYCODONE HCL 5 MG PO TABS: 2.5000 mg | ORAL_TABLET | ORAL | 0 refills | Status: DC | PRN

## 2016-10-15 MED ORDER — FENTANYL CITRATE (PF) 100 MCG/2ML IJ SOLN
INTRAMUSCULAR | Status: DC | PRN
  Administered 2016-10-15: 25 ug via INTRAVENOUS
  Administered 2016-10-15: 100 ug via INTRAVENOUS

## 2016-10-15 MED ORDER — FENTANYL CITRATE (PF) 100 MCG/2ML IJ SOLN
INTRAMUSCULAR | Status: AC
  Filled 2016-10-15: qty 2

## 2016-10-15 MED ORDER — CHLORHEXIDINE GLUCONATE CLOTH 2 % EX PADS: 6.0000 | MEDICATED_PAD | Freq: Once | CUTANEOUS | Status: DC

## 2016-10-15 MED ORDER — LIDOCAINE HCL (CARDIAC) 20 MG/ML IV SOLN
INTRAVENOUS | Status: DC | PRN
  Administered 2016-10-15: 30 mg via INTRAVENOUS

## 2016-10-15 MED ORDER — MIDAZOLAM HCL 2 MG/2ML IJ SOLN
INTRAMUSCULAR | Status: AC
  Filled 2016-10-15: qty 2

## 2016-10-15 MED ORDER — ACETAMINOPHEN 500 MG PO TABS
ORAL_TABLET | ORAL | Status: AC
  Filled 2016-10-15: qty 2

## 2016-10-15 MED ORDER — LIDOCAINE 2% (20 MG/ML) 5 ML SYRINGE
INTRAMUSCULAR | Status: AC
  Filled 2016-10-15: qty 5

## 2016-10-15 MED ORDER — TECHNETIUM TC 99M SULFUR COLLOID FILTERED: 1.0000 | Freq: Once | INTRAVENOUS | Status: DC | PRN

## 2016-10-15 MED ORDER — FENTANYL CITRATE (PF) 100 MCG/2ML IJ SOLN
50.0000 ug | INTRAMUSCULAR | Status: DC | PRN
  Administered 2016-10-15: 100 ug via INTRAVENOUS

## 2016-10-15 MED ORDER — CEFAZOLIN SODIUM-DEXTROSE 2-4 GM/100ML-% IV SOLN
2.0000 g | INTRAVENOUS | Status: AC
  Administered 2016-10-15: 2 g via INTRAVENOUS

## 2016-10-15 MED ORDER — SODIUM CHLORIDE 0.9 % IJ SOLN
INTRAMUSCULAR | Status: AC
  Filled 2016-10-15: qty 10

## 2016-10-15 MED ORDER — METHYLENE BLUE 0.5 % INJ SOLN
INTRAVENOUS | Status: AC
  Filled 2016-10-15: qty 10

## 2016-10-15 MED ORDER — CEFAZOLIN SODIUM-DEXTROSE 2-4 GM/100ML-% IV SOLN
INTRAVENOUS | Status: AC
  Filled 2016-10-15: qty 100

## 2016-10-15 MED ORDER — GLYCOPYRROLATE 0.2 MG/ML IJ SOLN
INTRAMUSCULAR | Status: DC | PRN
  Administered 2016-10-15: 0.2 mg via INTRAVENOUS

## 2016-10-15 MED ORDER — EPHEDRINE SULFATE 50 MG/ML IJ SOLN
INTRAMUSCULAR | Status: DC | PRN
  Administered 2016-10-15: 10 mg via INTRAVENOUS

## 2016-10-15 MED ORDER — GABAPENTIN 300 MG PO CAPS
300.0000 mg | ORAL_CAPSULE | ORAL | Status: AC
  Administered 2016-10-15: 300 mg via ORAL

## 2016-10-15 MED ORDER — PROMETHAZINE HCL 25 MG/ML IJ SOLN: 6.2500 mg | INTRAMUSCULAR | Status: DC | PRN

## 2016-10-15 MED ORDER — PROPOFOL 10 MG/ML IV BOLUS
INTRAVENOUS | Status: DC | PRN
  Administered 2016-10-15: 200 mg via INTRAVENOUS

## 2016-10-15 MED ORDER — CELECOXIB 200 MG PO CAPS
ORAL_CAPSULE | ORAL | Status: AC
  Filled 2016-10-15: qty 1

## 2016-10-15 MED ORDER — HYDROMORPHONE HCL 1 MG/ML IJ SOLN: 0.2500 mg | INTRAMUSCULAR | Status: DC | PRN

## 2016-10-15 MED ORDER — SCOPOLAMINE 1 MG/3DAYS TD PT72
1.0000 | MEDICATED_PATCH | Freq: Once | TRANSDERMAL | Status: DC | PRN
  Administered 2016-10-15: 1.5 mg via TRANSDERMAL

## 2016-10-15 MED ORDER — PHENYLEPHRINE HCL 10 MG/ML IJ SOLN
INTRAMUSCULAR | Status: DC | PRN
  Administered 2016-10-15 (×2): 80 ug via INTRAVENOUS

## 2016-10-15 MED ORDER — CELECOXIB 200 MG PO CAPS
200.0000 mg | ORAL_CAPSULE | ORAL | Status: AC
  Administered 2016-10-15: 200 mg via ORAL

## 2016-10-15 MED ORDER — PROPOFOL 10 MG/ML IV BOLUS
INTRAVENOUS | Status: AC
  Filled 2016-10-15: qty 20

## 2016-10-15 MED ORDER — MIDAZOLAM HCL 5 MG/5ML IJ SOLN
INTRAMUSCULAR | Status: DC | PRN
  Administered 2016-10-15: 2 mg via INTRAVENOUS

## 2016-10-15 MED ORDER — DEXAMETHASONE SODIUM PHOSPHATE 10 MG/ML IJ SOLN
INTRAMUSCULAR | Status: AC
  Filled 2016-10-15: qty 1

## 2016-10-15 MED ORDER — LIDOCAINE-EPINEPHRINE (PF) 1 %-1:200000 IJ SOLN
INTRAMUSCULAR | Status: AC
  Filled 2016-10-15: qty 30

## 2016-10-15 MED ORDER — DEXAMETHASONE SODIUM PHOSPHATE 4 MG/ML IJ SOLN
INTRAMUSCULAR | Status: DC | PRN
  Administered 2016-10-15: 10 mg via INTRAVENOUS

## 2016-10-15 MED ORDER — BUPIVACAINE-EPINEPHRINE (PF) 0.5% -1:200000 IJ SOLN
INTRAMUSCULAR | Status: DC | PRN
  Administered 2016-10-15: 30 mL

## 2016-10-15 MED ORDER — MIDAZOLAM HCL 2 MG/2ML IJ SOLN
1.0000 mg | INTRAMUSCULAR | Status: DC | PRN
  Administered 2016-10-15: 2 mg via INTRAVENOUS

## 2016-10-15 MED ORDER — ONDANSETRON HCL 4 MG/2ML IJ SOLN
INTRAMUSCULAR | Status: AC
  Filled 2016-10-15: qty 2

## 2016-10-15 MED ORDER — SCOPOLAMINE 1 MG/3DAYS TD PT72
MEDICATED_PATCH | TRANSDERMAL | Status: AC
  Filled 2016-10-15: qty 1

## 2016-10-15 MED ORDER — ACETAMINOPHEN 500 MG PO TABS
1000.0000 mg | ORAL_TABLET | ORAL | Status: AC
  Administered 2016-10-15: 1000 mg via ORAL

## 2016-10-15 MED ORDER — PHENYLEPHRINE 40 MCG/ML (10ML) SYRINGE FOR IV PUSH (FOR BLOOD PRESSURE SUPPORT)
PREFILLED_SYRINGE | INTRAVENOUS | Status: AC
  Filled 2016-10-15: qty 10

## 2016-10-15 MED ORDER — SCOPOLAMINE 1 MG/3DAYS TD PT72: 1.0000 | MEDICATED_PATCH | TRANSDERMAL | Status: DC

## 2016-10-15 MED ORDER — ONDANSETRON HCL 4 MG/2ML IJ SOLN
INTRAMUSCULAR | Status: DC | PRN
  Administered 2016-10-15: 4 mg via INTRAVENOUS

## 2016-10-15 MED ORDER — BUPIVACAINE HCL 0.25 % IJ SOLN
INTRAMUSCULAR | Status: DC | PRN
  Administered 2016-10-15: 20 mL via INTRADERMAL

## 2016-10-15 MED ORDER — GABAPENTIN 300 MG PO CAPS
ORAL_CAPSULE | ORAL | Status: AC
  Filled 2016-10-15: qty 1

## 2016-10-15 SURGICAL SUPPLY — 66 items
BINDER BREAST LRG (GAUZE/BANDAGES/DRESSINGS) IMPLANT
BINDER BREAST MEDIUM (GAUZE/BANDAGES/DRESSINGS) ×2 IMPLANT
BINDER BREAST XLRG (GAUZE/BANDAGES/DRESSINGS) IMPLANT
BINDER BREAST XXLRG (GAUZE/BANDAGES/DRESSINGS) IMPLANT
BLADE SURG 10 STRL SS (BLADE) ×2 IMPLANT
BLADE SURG 15 STRL LF DISP TIS (BLADE) ×1 IMPLANT
BLADE SURG 15 STRL SS (BLADE) ×1
BNDG COHESIVE 4X5 TAN STRL (GAUZE/BANDAGES/DRESSINGS) ×2 IMPLANT
CANISTER SUC SOCK COL 7IN (MISCELLANEOUS) IMPLANT
CANISTER SUCT 1200ML W/VALVE (MISCELLANEOUS) ×2 IMPLANT
CHLORAPREP W/TINT 26ML (MISCELLANEOUS) ×2 IMPLANT
CLIP VESOCCLUDE LG 6/CT (CLIP) ×2 IMPLANT
CLIP VESOCCLUDE MED 6/CT (CLIP) ×4 IMPLANT
CLIP VESOCCLUDE SM WIDE 6/CT (CLIP) IMPLANT
COVER MAYO STAND STRL (DRAPES) ×2 IMPLANT
COVER PROBE W GEL 5X96 (DRAPES) ×2 IMPLANT
DECANTER SPIKE VIAL GLASS SM (MISCELLANEOUS) IMPLANT
DERMABOND ADVANCED (GAUZE/BANDAGES/DRESSINGS) ×1
DERMABOND ADVANCED .7 DNX12 (GAUZE/BANDAGES/DRESSINGS) ×1 IMPLANT
DEVICE DUBIN W/COMP PLATE 8390 (MISCELLANEOUS) ×2 IMPLANT
DRAPE UTILITY XL STRL (DRAPES) ×2 IMPLANT
DRSG PAD ABDOMINAL 8X10 ST (GAUZE/BANDAGES/DRESSINGS) ×2 IMPLANT
ELECT COATED BLADE 2.86 ST (ELECTRODE) ×2 IMPLANT
ELECT REM PT RETURN 9FT ADLT (ELECTROSURGICAL) ×2
ELECTRODE REM PT RTRN 9FT ADLT (ELECTROSURGICAL) ×1 IMPLANT
GAUZE SPONGE 4X4 12PLY STRL LF (GAUZE/BANDAGES/DRESSINGS) ×2 IMPLANT
GLOVE BIO SURGEON STRL SZ 6 (GLOVE) ×2 IMPLANT
GLOVE BIO SURGEON STRL SZ 6.5 (GLOVE) ×2 IMPLANT
GLOVE BIOGEL PI IND STRL 6.5 (GLOVE) ×1 IMPLANT
GLOVE BIOGEL PI IND STRL 7.0 (GLOVE) ×1 IMPLANT
GLOVE BIOGEL PI IND STRL 7.5 (GLOVE) ×1 IMPLANT
GLOVE BIOGEL PI INDICATOR 6.5 (GLOVE) ×1
GLOVE BIOGEL PI INDICATOR 7.0 (GLOVE) ×1
GLOVE BIOGEL PI INDICATOR 7.5 (GLOVE) ×1
GLOVE SURG SS PI 6.5 STRL IVOR (GLOVE) ×2 IMPLANT
GOWN STRL REUS W/ TWL LRG LVL3 (GOWN DISPOSABLE) ×1 IMPLANT
GOWN STRL REUS W/TWL 2XL LVL3 (GOWN DISPOSABLE) ×2 IMPLANT
GOWN STRL REUS W/TWL LRG LVL3 (GOWN DISPOSABLE) ×1
KIT MARKER MARGIN INK (KITS) ×2 IMPLANT
LIGHT WAVEGUIDE WIDE FLAT (MISCELLANEOUS) ×2 IMPLANT
NDL SAFETY ECLIPSE 18X1.5 (NEEDLE) IMPLANT
NEEDLE HYPO 18GX1.5 SHARP (NEEDLE)
NEEDLE HYPO 25X1 1.5 SAFETY (NEEDLE) ×2 IMPLANT
NS IRRIG 1000ML POUR BTL (IV SOLUTION) ×2 IMPLANT
PACK BASIN DAY SURGERY FS (CUSTOM PROCEDURE TRAY) ×2 IMPLANT
PACK UNIVERSAL I (CUSTOM PROCEDURE TRAY) ×2 IMPLANT
PENCIL BUTTON HOLSTER BLD 10FT (ELECTRODE) ×2 IMPLANT
SLEEVE SCD COMPRESS KNEE MED (MISCELLANEOUS) ×2 IMPLANT
SPONGE LAP 18X18 X RAY DECT (DISPOSABLE) ×4 IMPLANT
STAPLER VISISTAT 35W (STAPLE) IMPLANT
STOCKINETTE IMPERVIOUS LG (DRAPES) ×2 IMPLANT
STRIP CLOSURE SKIN 1/2X4 (GAUZE/BANDAGES/DRESSINGS) ×2 IMPLANT
SUT ETHILON 2 0 FS 18 (SUTURE) IMPLANT
SUT MNCRL AB 4-0 PS2 18 (SUTURE) ×2 IMPLANT
SUT MON AB 5-0 PS2 18 (SUTURE) IMPLANT
SUT SILK 2 0 SH (SUTURE) IMPLANT
SUT VIC AB 2-0 SH 27 (SUTURE) ×1
SUT VIC AB 2-0 SH 27XBRD (SUTURE) ×1 IMPLANT
SUT VIC AB 3-0 SH 27 (SUTURE) ×1
SUT VIC AB 3-0 SH 27X BRD (SUTURE) ×1 IMPLANT
SUT VICRYL 3-0 CR8 SH (SUTURE) IMPLANT
SYR CONTROL 10ML LL (SYRINGE) ×2 IMPLANT
TOWEL OR 17X24 6PK STRL BLUE (TOWEL DISPOSABLE) ×2 IMPLANT
TOWEL OR NON WOVEN STRL DISP B (DISPOSABLE) ×2 IMPLANT
TUBE CONNECTING 20X1/4 (TUBING) ×2 IMPLANT
YANKAUER SUCT BULB TIP NO VENT (SUCTIONS) ×2 IMPLANT

## 2016-10-15 NOTE — Transfer of Care (Signed)
Immediate Anesthesia Transfer of Care Note  Patient: Grace Cunningham  Procedure(s) Performed: LEFT BREAST LUMPECTOMY WITH RADIOACTIVE SEED AND SENTINEL LYMPH NODE BIOPSY (Left Breast)  Patient Location: PACU  Anesthesia Type:GA combined with regional for post-op pain  Level of Consciousness: sedated  Airway & Oxygen Therapy: Patient Spontanous Breathing and Patient connected to face mask oxygen  Post-op Assessment: Report given to RN and Post -op Vital signs reviewed and stable  Post vital signs: Reviewed and stable  Last Vitals:  Vitals:   10/15/16 0724 10/15/16 0728  BP:    Pulse: (!) 57 63  Resp: 17 16  Temp:    SpO2: 98% 97%    Last Pain:  Vitals:   10/15/16 0635  TempSrc: Oral         Complications: No apparent anesthesia complications

## 2016-10-15 NOTE — Progress Notes (Signed)
Assisted nuc med tech with nuc med inj with  . Side rails up, monitors on throughout procedure. See vital signs in flow sheet. Tolerated Procedure well. 

## 2016-10-15 NOTE — Anesthesia Procedure Notes (Addendum)
Anesthesia Regional Block: Pectoralis block   Pre-Anesthetic Checklist: ,, timeout performed, Correct Patient, Correct Site, Correct Laterality, Correct Procedure, Correct Position, site marked, Risks and benefits discussed,  Surgical consent,  Pre-op evaluation,  At surgeon's request and post-op pain management  Laterality: Left  Prep: chloraprep       Needles:  Injection technique: Single-shot  Needle Type: Echogenic Stimulator Needle     Needle Length: 10cm  Needle Gauge: 21     Additional Needles:   Procedures:,,,, ultrasound used (permanent image in chart),,,,  Narrative:  Start time: 10/15/2016 6:53 AM End time: 10/15/2016 7:03 AM Injection made incrementally with aspirations every 5 mL.  Performed by: Personally

## 2016-10-15 NOTE — H&P (Signed)
Grace Cunningham 10/03/2016 7:38 AM Location: [facility] Patient #: [id number] DOB: Jan 09, 1961 Undefined / Language: Grace Cunningham / Race: Asian Female   History of Present Illness Grace Klein MD; 10/03/2016 3:39 PM) The patient is a 56 year old female who presents with breast cancer. Pt is a 56 yo F referred by Dr. Radene Cunningham for consultation regarding new diagnosis of left breast cancer 09/2016. She presented with screening detected architectural distortion. Diagnostic imaging was obtained and showed a 1.2 cm mass at 9:30 in the left breast. The axilla was negative. Core needle biopsy was performed and showed a grade 1 invasive ductal carcinoma with DCIS. ER/PR +, Her 2 -, Ki 67 3%.   She had menarche at age 42. She is a G3P1 with child in the early 52s. She breast fed for 7-12 months and has had some OCP use. 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    dx mammogram/us 09/24/2016 ACR Breast Density Category c: The breast tissue is heterogeneously dense, which may obscure small masses.  FINDINGS: There is distortion in the left retroareolar region which persists on today's imaging. Calcifications in the left breast are stable for many years.  On physical exam, no suspicious lumps are identified.  Targeted ultrasound is performed, showing a suspicious irregular mass at 9:30 in the retroareolar region measuring 6 x 12 x 10 mm. No axillary adenopathy.  IMPRESSION: Suspicious mass in the left breast.  pathology 09/26/2016 Breast, left, needle core biopsy, 9:30 o'clock subareolar - INVASIVE DUCTAL CARCINOMA. - DUCTAL CARCINOMA IN SITU. - SEE COMMENT. Microscopic Comment The carcinoma appears grade 1.  CBC, CMET normal   Past Surgical History Grace Pummel, RN; 10/03/2016 7:38 AM) Appendectomy  Breast Biopsy  Left. Oral Surgery   Diagnostic Studies History Grace Pummel, RN; 10/03/2016 7:38 AM) Colonoscopy  never Mammogram   within last year Pap Smear  1-5 years ago  Medication History Grace Pummel, RN; 10/03/2016 7:38 AM) Medications Reconciled  Social History Grace Pummel, RN; 10/03/2016 7:38 AM) Alcohol use  Occasional alcohol use. Caffeine use  Tea. No drug use  Tobacco use  Never smoker.  Family History Grace Pummel, RN; 10/03/2016 7:38 AM) Hypertension  Father, Sister. Respiratory Condition  Family Members In General, Mother.  Pregnancy / Birth History Grace Pummel, RN; 10/03/2016 7:38 AM) Age at menarche  57 years. Contraceptive History  Oral contraceptives. Gravida  3 Irregular periods  Length (months) of breastfeeding  7-12 Maternal age  20-35 Para  1  Other Problems Grace Pummel, RN; 10/03/2016 7:38 AM) Arthritis  Depression  Hemorrhoids  Lump In Breast  Sleep Apnea     Review of Systems Grace Spillers Ledford RN; 10/03/2016 7:38 AM) General Not Present- Appetite Loss, Chills, Fatigue, Fever, Night Sweats, Weight Gain and Weight Loss. Skin Not Present- Change in Wart/Mole, Dryness, Hives, Jaundice, New Lesions, Non-Healing Wounds, Rash and Ulcer. HEENT Present- Seasonal Allergies and Wears glasses/contact lenses. Not Present- Earache, Hearing Loss, Hoarseness, Nose Bleed, Oral Ulcers, Ringing in the Ears, Sinus Pain, Sore Throat, Visual Disturbances and Yellow Eyes. Respiratory Present- Snoring. Not Present- Bloody sputum, Chronic Cough, Difficulty Breathing and Wheezing. Breast Present- Breast Mass. Not Present- Breast Pain, Nipple Discharge and Skin Changes. Cardiovascular Not Present- Chest Pain, Difficulty Breathing Lying Down, Leg Cramps, Palpitations, Rapid Heart Rate, Shortness of Breath and Swelling of Extremities. Gastrointestinal Present- Hemorrhoids. Not Present- Abdominal Pain, Bloating, Bloody Stool, Change in Bowel Habits, Chronic diarrhea, Constipation, Difficulty Swallowing, Excessive gas, Gets full quickly at meals,  Indigestion, Nausea, Rectal Pain  and Vomiting. Female Genitourinary Not Present- Frequency, Nocturia, Painful Urination, Pelvic Pain and Urgency. Musculoskeletal Present- Joint Stiffness. Not Present- Back Pain, Joint Pain, Muscle Pain, Muscle Weakness and Swelling of Extremities. Neurological Not Present- Decreased Memory, Fainting, Headaches, Numbness, Seizures, Tingling, Tremor, Trouble walking and Weakness. Psychiatric Present- Depression. Not Present- Anxiety, Bipolar, Change in Sleep Pattern, Fearful and Frequent crying. Endocrine Not Present- Cold Intolerance, Excessive Hunger, Hair Changes, Heat Intolerance, Hot flashes and New Diabetes. Hematology Not Present- Blood Thinners, Easy Bruising, Excessive bleeding, Gland problems, HIV and Persistent Infections.  Vitals Grace Klein MD; 10/03/2016 3:39 PM) 10/03/2016 3:38 PM Weight: 132.55 lb Height: 64in Body Surface Area: 1.64 m Body Mass Index: 22.75 kg/m  Temp.: 98.70F  Pulse: 59 (Regular)  Resp.: 18 (Unlabored)  BP: 122/81 (Sitting, Left Arm, Standard)       Physical Exam Grace Klein MD; 10/03/2016 3:41 PM) General Mental Status-Alert. General Appearance-Consistent with stated age. Hydration-Well hydrated. Voice-Normal.  Head and Neck Head-normocephalic, atraumatic with no lesions or palpable masses. Trachea-midline. Thyroid Gland Characteristics - normal size and consistency.  Eye Eyeball - Bilateral-Extraocular movements intact. Sclera/Conjunctiva - Bilateral-No scleral icterus.  Chest and Lung Exam Chest and lung exam reveals -quiet, even and easy respiratory effort with no use of accessory muscles and on auscultation, normal breath sounds, no adventitious sounds and normal vocal resonance. Inspection Chest Wall - Normal. Back - normal.  Breast Note: minimal ptosis. symmetric bilaterally. small amount of bruising on right lateral breast from bx. no palpable masses. no skin dimpling, nipple discharge, nipple  retraction. left breast negative.   Cardiovascular Cardiovascular examination reveals -normal heart sounds, regular rate and rhythm with no murmurs and normal pedal pulses bilaterally.  Abdomen Inspection Inspection of the abdomen reveals - No Hernias. Palpation/Percussion Palpation and Percussion of the abdomen reveal - Soft, Non Tender, No Rebound tenderness, No Rigidity (guarding) and No hepatosplenomegaly. Auscultation Auscultation of the abdomen reveals - Bowel sounds normal.  Neurologic Neurologic evaluation reveals -alert and oriented x 3 with no impairment of recent or remote memory. Mental Status-Normal.  Musculoskeletal Global Assessment -Note: no gross deformities.  Normal Exam - Left-Upper Extremity Strength Normal and Lower Extremity Strength Normal. Normal Exam - Right-Upper Extremity Strength Normal and Lower Extremity Strength Normal.  Lymphatic Head & Neck  General Head & Neck Lymphatics: Bilateral - Description - Normal. Axillary  General Axillary Region: Bilateral - Description - Normal. Tenderness - Non Tender. Femoral & Inguinal  Generalized Femoral & Inguinal Lymphatics: Bilateral - Description - No Generalized lymphadenopathy.    Assessment & Plan Grace Klein MD; 10/03/2016 3:46 PM) PRIMARY CANCER OF UPPER INNER QUADRANT OF LEFT FEMALE BREAST (C50.212) Impression: Pt has a new diagnosis of a cT1bN0 left breast cancer. This is hormone receptor positive and Her 2 negative with a low proliferation rate. Her prognosis is very good.  I will plan seed localized lumpectomy with sentinel lymph node biopsy. This would be followed by oncotype or mammaprint depending on LN pathology. This would be followed by external beam radiation and an antihormone tx.  The surgical procedure was described to the patient. I discussed the incision type and location and that we would need radiology involved on with a wire or seed marker and/or sentinel  node.  The risks and benefits of the procedure were described to the patient and she wishes to proceed.  We discussed the risks bleeding, infection, damage to other structures, need for further procedures/surgeries. We discussed the risk of seroma. The  patient was advised if the area in the breast in cancer, we may need to go back to surgery for additional tissue to obtain negative margins or for a lymph node biopsy. The patient was advised that these are the most common complications, but that others can occur as well. They were advised against taking aspirin or other anti-inflammatory agents/blood thinners the week before surgery.  She would like to proceed as soon as possible. Current Plans You are being scheduled for surgery- Our schedulers will call you.  You should hear from our office's scheduling department within 5 working days about the location, date, and time of surgery. We try to make accommodations for patient's preferences in scheduling surgery, but sometimes the OR schedule or the surgeon's schedule prevents [place] from making those accommodations.  If you have not heard from our office [phone]) in 5 working days, call the office and ask for your surgeon's nurse.  If you have other questions about your diagnosis, plan, or surgery, call the office and ask for your surgeon's nurse.  Pt Education - [facility] Breast Cancer Information Given - [facility] "Breast Journey" Package Pt Education - [facility] breast cancer surgery: discussed with patient and provided information.   Signed by Grace Klein, MD (10/03/2016 3:46 PM)

## 2016-10-15 NOTE — Anesthesia Postprocedure Evaluation (Signed)
Anesthesia Post Note  Patient: Radio broadcast assistant  Procedure(s) Performed: LEFT BREAST LUMPECTOMY WITH RADIOACTIVE SEED AND SENTINEL LYMPH NODE BIOPSY (Left Breast)     Patient location during evaluation: PACU Anesthesia Type: General Level of consciousness: awake and alert Pain management: pain level controlled Vital Signs Assessment: post-procedure vital signs reviewed and stable Respiratory status: spontaneous breathing, nonlabored ventilation and respiratory function stable Cardiovascular status: blood pressure returned to baseline and stable Postop Assessment: no apparent nausea or vomiting Anesthetic complications: no    Last Vitals:  Vitals:   10/15/16 1000 10/15/16 1045  BP: 112/72 123/80  Pulse: 88 88  Resp: (!) 21 20  Temp:  36.6 C  SpO2: 100% 96%    Last Pain:  Vitals:   10/15/16 1045  TempSrc:   PainSc: 0-No pain                 Catalina Gravel

## 2016-10-15 NOTE — Op Note (Signed)
Left Breast Radioactive seed localized lumpectomy and sentinel lymph node biopsy  Indications: This patient presents with history of left breast cancer, upper inner quadrant, cT1cN0, +/+/-  Pre-operative Diagnosis: left breast cancer  Post-operative Diagnosis: Left breast cnacer  Surgeon: [id number],HERDE   Anesthesia: General endotracheal anesthesia  ASA Class: 2  Procedure Details  The patient was seen in the Holding Room. The risks, benefits, complications, treatment options, and expected outcomes were discussed with the patient. The possibilities of bleeding, infection, the need for additional procedures, failure to diagnose a condition, and creating a complication requiring transfusion or operation were discussed with the patient. The patient concurred with the proposed plan, giving informed consent.  The site of surgery properly noted/marked. The patient was taken to Operating Room # 8, identified, and the procedure verified as left Breast seed localized lumpectomy with sentinel lymph node biopsy. A Time Out was held and the above information confirmed.  The left arm, breast, and chest were prepped and draped in standard fashion. The lumpectomy was performed by creating a circumareolar incision over the upper outer quadrant of the breast over the previously placed radioactive seed.  Dissection was carried down to around the point of maximum signal intensity. The cautery was used to perform the dissection.  Hemostasis was achieved with cautery. The edges of the cavity were marked with large clips, with one each medial, lateral, inferior and superior, and two clips posteriorly.   The specimen was inked with the margin marker paint kit.    Specimen radiography confirmed inclusion of the mammographic lesion, the clip, and the seed.  The background signal in the breast was zero.  The wound was irrigated and closed with 3-0 vicryl in layers and 4-0 monocryl subcuticular suture.    Using a hand-held  gamma probe, left axillary sentinel nodes were identified transcutaneously.  An oblique incision was created below the axillary hairline.  Dissection was carried through the clavipectoral fascia.  Two deep level 2 axillary sentinel nodes were removed.  Counts per second were 290 and 80.    The background count was 5 cps.  The wound was irrigated.  Hemostasis was achieved with cautery.  The axillary incision was closed with a 3-0 vicryl deep dermal interrupted sutures and a 4-0 monocryl subcuticular closure.    Sterile dressings were applied. At the end of the operation, all sponge, instrument, and needle counts were correct.  Findings: grossly clear surgical margins and no adenopathy  Estimated Blood Loss:  min         Specimens: left breast lumpectomy and two axillary sentinel lymph nodes.             Complications:  None; patient tolerated the procedure well.         Disposition: PACU - hemodynamically stable.         Condition: stable

## 2016-10-15 NOTE — Anesthesia Procedure Notes (Signed)
Procedure Name: LMA Insertion Date/Time: 10/15/2016 7:41 AM Performed by: Toula Moos L Pre-anesthesia Checklist: Patient identified, Emergency Drugs available, Suction available, Patient being monitored and Timeout performed Patient Re-evaluated:Patient Re-evaluated prior to induction Oxygen Delivery Method: Circle system utilized Preoxygenation: Pre-oxygenation with 100% oxygen Induction Type: IV induction Ventilation: Mask ventilation without difficulty LMA: LMA inserted LMA Size: 4.0 Number of attempts: 1 Airway Equipment and Method: Bite block Placement Confirmation: positive ETCO2 Tube secured with: Tape Dental Injury: Teeth and Oropharynx as per pre-operative assessment

## 2016-10-15 NOTE — Interval H&P Note (Signed)
History and Physical Interval Note:  10/15/2016 7:29 AM  Grace Cunningham  has presented today for surgery, with the diagnosis of Left breast cancer  The various methods of treatment have been discussed with the patient and family. After consideration of risks, benefits and other options for treatment, the patient has consented to  Procedure(s): LEFTBREAST LUMPECTOMY WITH RADIOACTIVE SEED AND SENTINEL LYMPH NODE BIOPSY ERAS PATHWAY (Left) as a surgical intervention .  The patient's history has been reviewed, patient examined, no change in status, stable for surgery.  I have reviewed the patient's chart and labs.  Questions were answered to the patient's satisfaction.     Grace Cunningham

## 2016-10-15 NOTE — Anesthesia Preprocedure Evaluation (Addendum)
Anesthesia Evaluation  Patient identified by MRN, date of birth, ID band Patient awake    Reviewed: Allergy & Precautions, NPO status , Patient's Chart, lab work & pertinent test results  History of Anesthesia Complications Negative for: history of anesthetic complications  Airway Mallampati: II  TM Distance: >3 FB Neck ROM: Full    Dental  (+) Dental Advisory Given, Partial Lower, Partial Upper   Pulmonary neg pulmonary ROS, sleep apnea and Continuous Positive Airway Pressure Ventilation ,    Pulmonary exam normal        Cardiovascular negative cardio ROS Normal cardiovascular exam     Neuro/Psych PSYCHIATRIC DISORDERS Anxiety Depression negative neurological ROS     GI/Hepatic negative GI ROS, Neg liver ROS,   Endo/Other  negative endocrine ROS  Renal/GU negative Renal ROS  negative genitourinary   Musculoskeletal negative musculoskeletal ROS (+)   Abdominal   Peds negative pediatric ROS (+)  Hematology negative hematology ROS (+)   Anesthesia Other Findings   Reproductive/Obstetrics negative OB ROS                            Anesthesia Physical Anesthesia Plan  ASA: II  Anesthesia Plan: General   Post-op Pain Management: GA combined w/ Regional for post-op pain   Induction: Intravenous  PONV Risk Score and Plan: 3 and Ondansetron, Dexamethasone, Scopolamine patch - Pre-op and Diphenhydramine  Airway Management Planned: LMA  Additional Equipment:   Intra-op Plan:   Post-operative Plan: Extubation in OR  Informed Consent: I have reviewed the patients History and Physical, chart, labs and discussed the procedure including the risks, benefits and alternatives for the proposed anesthesia with the patient or authorized representative who has indicated his/her understanding and acceptance.   Dental advisory given  Plan Discussed with: CRNA, Anesthesiologist and  Surgeon  Anesthesia Plan Comments:         Anesthesia Quick Evaluation

## 2016-10-15 NOTE — H&P (Signed)
Assisted Dr. Singer with left, ultrasound guided, pectoralis block. Side rails up, monitors on throughout procedure. See vital signs in flow sheet. Tolerated Procedure well. °

## 2016-10-15 NOTE — Discharge Instructions (Addendum)
[facility] °Office Phone Number [phone] ° °BREAST BIOPSY/ PARTIAL MASTECTOMY: POST OP INSTRUCTIONS ° °Always review your discharge instruction sheet given to you by the facility where your surgery was performed. ° °IF YOU HAVE DISABILITY OR FAMILY LEAVE FORMS, YOU MUST BRING THEM TO THE OFFICE FOR PROCESSING.  DO NOT GIVE THEM TO YOUR DOCTOR. ° °1. A prescription for pain medication may be given to you upon discharge.  Take your pain medication as prescribed, if needed.  If narcotic pain medicine is not needed, then you may take acetaminophen (Tylenol) or ibuprofen (Advil) as needed. °2. Take your usually prescribed medications unless otherwise directed °3. If you need a refill on your pain medication, please contact your pharmacy.  They will contact our office to request authorization.  Prescriptions will not be filled after 5pm or on week-ends. °4. You should eat very light the first 24 hours after surgery, such as soup, crackers, pudding, etc.  Resume your normal diet the day after surgery. °5. Most patients will experience some swelling and bruising in the breast.  Ice packs and a good support bra will help.  Swelling and bruising can take several days to resolve.  °6. It is common to experience some constipation if taking pain medication after surgery.  Increasing fluid intake and taking a stool softener will usually help or prevent this problem from occurring.  A mild laxative (Milk of Magnesia or Miralax) should be taken according to package directions if there are no bowel movements after 48 hours. °7. Unless discharge instructions indicate otherwise, you may remove your bandages 48 hours after surgery, and you may shower at that time.  You may have steri-strips (small skin tapes) in place directly over the incision.  These strips should be left on the skin for 7-10 days.   Any sutures or staples will be removed at the office during your follow-up visit. °8. ACTIVITIES:  You may resume  regular daily activities (gradually increasing) beginning the next day.  Wearing a good support bra or sports bra (or the breast binder) minimizes pain and swelling.  You may have sexual intercourse when it is comfortable. °a. You may drive when you no longer are taking prescription pain medication, you can comfortably wear a seatbelt, and you can safely maneuver your car and apply brakes. °b. RETURN TO WORK:  __________1 week_______________ °9. You should see your doctor in the office for a follow-up appointment approximately two weeks after your surgery.  Your doctor’s nurse will typically make your follow-up appointment when she calls you with your pathology report.  Expect your pathology report 2-3 business days after your surgery.  You may call to check if you do not hear from us after three days. ° ° °WHEN TO CALL YOUR DOCTOR: °1. Fever over 101.0 °2. Nausea and/or vomiting. °3. Extreme swelling or bruising. °4. Continued bleeding from incision. °5. Increased pain, redness, or drainage from the incision. ° °The clinic staff is available to answer your questions during regular business hours.  Please don’t hesitate to call and ask to speak to one of the nurses for clinical concerns.  If you have a medical emergency, go to the nearest emergency room or call 911.  A surgeon from [facility] is always on call at the hospital. ° °For further questions, please visit [email or web address]  ° ° °Post Anesthesia Home Care Instructions ° °Activity: °Get plenty of rest for the remainder of the day. A responsible individual must stay with you for 24   hours following the procedure.  °For the next 24 hours, DO NOT: °-Drive a car °-Operate machinery °-Drink alcoholic beverages °-Take any medication unless instructed by your physician °-Make any legal decisions or sign important papers. ° °Meals: °Start with liquid foods such as gelatin or soup. Progress to regular foods as tolerated. Avoid greasy, spicy,  heavy foods. If nausea and/or vomiting occur, drink only clear liquids until the nausea and/or vomiting subsides. Call your physician if vomiting continues. ° °Special Instructions/Symptoms: °Your throat may feel dry or sore from the anesthesia or the breathing tube placed in your throat during surgery. If this causes discomfort, gargle with warm salt water. The discomfort should disappear within 24 hours. ° °If you had a scopolamine patch placed behind your ear for the management of post- operative nausea and/or vomiting: ° °1. The medication in the patch is effective for 72 hours, after which it should be removed.  Wrap patch in a tissue and discard in the trash. Wash hands thoroughly with soap and water. °2. You may remove the patch earlier than 72 hours if you experience unpleasant side effects which may include dry mouth, dizziness or visual disturbances. °3. Avoid touching the patch. Wash your hands with soap and water after contact with the patch. °  ° °

## 2016-10-16 ENCOUNTER — Encounter (HOSPITAL_BASED_OUTPATIENT_CLINIC_OR_DEPARTMENT_OTHER): Payer: Self-pay | Admitting: General Surgery

## 2016-10-16 NOTE — Addendum Note (Signed)
Addendum  created 10/16/16 1841 by Duane Boston, MD   Anesthesia Intra Blocks edited, Sign clinical note

## 2016-10-16 NOTE — Addendum Note (Signed)
Addendum  created 10/16/16 1842 by Duane Boston, MD   Anesthesia Intra Blocks edited, Sign clinical note

## 2016-10-16 NOTE — Progress Notes (Signed)
Please let patient know margins and lymph nodes are negative.

## 2016-10-19 ENCOUNTER — Encounter: Payer: Self-pay | Admitting: General Practice

## 2016-10-19 NOTE — Progress Notes (Signed)
[facility] Note  LVM of encouragement, including my direct dial for return call.  [place], [place], [facility] Pager [phone] Voicemail [phone]

## 2016-10-22 ENCOUNTER — Ambulatory Visit (HOSPITAL_BASED_OUTPATIENT_CLINIC_OR_DEPARTMENT_OTHER): Payer: BC Managed Care – PPO | Admitting: Hematology and Oncology

## 2016-10-22 ENCOUNTER — Telehealth: Payer: Self-pay | Admitting: *Deleted

## 2016-10-22 DIAGNOSIS — Z170 Estrogen receptor positive status [ER+]: Secondary | ICD-10-CM | POA: Diagnosis not present

## 2016-10-22 DIAGNOSIS — C50212 Malignant neoplasm of upper-inner quadrant of left female breast: Secondary | ICD-10-CM | POA: Diagnosis not present

## 2016-10-22 NOTE — Telephone Encounter (Signed)
Received oncotype testing order. Requisition sent to pathology and [facility]. PAC sent to [facility]

## 2016-10-22 NOTE — Assessment & Plan Note (Signed)
10/02/2018Left lumpectomy: IDC grade 1, 1.2 cm, DCIS with necrosis, margins negative, 0/2 lymph nodes negative, ER 95%, PR 40%, HER-2 negative ratio 0.84, Ki-67 3%, T1 CN 0 stage IA  Pathology counseling: I discussed the final pathology report of the patient provided  a copy of this report. I discussed the margins as well as lymph node surgeries. We also discussed the final staging along with previously performed ER/PR and HER-2/neu testing.  Treatment plan: 1. Oncotype DX testing to determine if chemotherapy would be of any benefit followed by 2. Adjuvant radiation therapy followed by 3. Adjuvant antiestrogen therapy  Return to clinic based upon Oncotype DX test result

## 2016-10-22 NOTE — Progress Notes (Signed)
Patient Care Team: Rankins, Bill Salinas, MD as PCP - General (Family Medicine) [name], MD as Consulting Physician (General Surgery) Nicholas Lose, MD as Consulting Physician (Hematology and Oncology) Kyung Rudd, MD as Consulting Physician (Radiation Oncology)  DIAGNOSIS:  Encounter Diagnosis  Name Primary?  . Malignant neoplasm of upper-inner quadrant of left breast in female, estrogen receptor positive ([place])     SUMMARY OF ONCOLOGIC HISTORY:   Malignant neoplasm of upper-inner quadrant of left breast in female, estrogen receptor positive ([place])   09/26/2016 Initial Diagnosis    Screening detected distortion in the left breast retroareolar region at 9:30 position 1.2 cm, axilla negative. Biopsy revealed grade 1 invasive ductal carcinoma with DCIS ER 95%, PR 40%, HER-2 negative ratio 0.84, Ki-67 3%, T1c N0 stage IA AJCC 8      10/16/2016 Surgery    Left lumpectomy: IDC grade 1, 1.2 cm, DCIS with necrosis, margins negative, 0/2 lymph nodes negative, ER 95%, PR 40%, HER-2 negative ratio 0.84, Ki-67 3%, T1 CN 0 stage IA       CHIEF COMPLIANT: follow-up after recent left lumpectomy  INTERVAL HISTORY: Grace Cunningham is a 56 year old with above-mentioned history left breast cancer treated with lumpectomy and is here to discuss the pathology report. She is healing very well from the recent surgery and reports no major problems or concerns. 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  REVIEW OF SYSTEMS:   Constitutional: Denies fevers, chills or abnormal weight loss Eyes: Denies blurriness of vision Ears, nose, mouth, throat, and face: Denies mucositis or sore throat Respiratory: Denies cough, dyspnea or wheezes Cardiovascular: Denies palpitation, chest discomfort Gastrointestinal:  Denies nausea, heartburn or change in bowel habits Skin: Denies abnormal skin rashes Lymphatics: Denies new lymphadenopathy or easy bruising Neurological:Denies numbness, tingling or  new weaknesses Behavioral/Psych: Mood is stable, no new changes  Extremities: No lower extremity edema Breast: recent left lumpectomy All other systems were reviewed with the patient and are negative.  I have reviewed the past medical history, past surgical history, social history and family history with the patient and they are unchanged from previous note.  ALLERGIES:  has No Known Allergies.  MEDICATIONS:  Current Outpatient Prescriptions  Medication Sig Dispense Refill  . ALPRAZolam (XANAX) 0.25 MG tablet Take 0.25 mg by mouth at bedtime as needed for anxiety.    . Melatonin 1 MG CAPS Take 1 capsule by mouth 3 (three) times a week.    . Multiple Vitamin (MULTIVITAMIN) tablet Take 1 tablet by mouth as needed.    Marland Kitchen oxyCODONE (OXY IR/ROXICODONE) 5 MG immediate release tablet Take 0.5-2 tablets (2.5-10 mg total) by mouth every 4 (four) hours as needed for moderate pain, severe pain or breakthrough pain. 15 tablet 0  . sertraline (ZOLOFT) 50 MG tablet Take 50 mg by mouth daily.     No current facility-administered medications for this visit.     PHYSICAL EXAMINATION: ECOG PERFORMANCE STATUS: 1 - Symptomatic but completely ambulatory  Vitals:   10/22/16 1401  BP: 110/66  Pulse: 74  Resp: 18  Temp: 98.6 F (37 C)  SpO2: 98%   Filed Weights   10/22/16 1401  Weight: 133 lb 14.4 oz (60.7 kg)    GENERAL:alert, no distress and comfortable SKIN: skin color, texture, turgor are normal, no rashes or significant lesions EYES: normal, Conjunctiva are pink and non-injected, sclera clear OROPHARYNX:no exudate, no erythema and lips, buccal mucosa, and tongue normal  NECK: supple, thyroid normal size, non-tender, without nodularity  LYMPH:  no palpable lymphadenopathy in the cervical, axillary or inguinal LUNGS: clear to auscultation and percussion with normal breathing effort HEART: regular rate & rhythm and no murmurs and no lower extremity edema ABDOMEN:abdomen soft, non-tender and  normal bowel sounds MUSCULOSKELETAL:no cyanosis of digits and no clubbing  NEURO: alert & oriented x 3 with fluent speech, no focal motor/sensory deficits EXTREMITIES: No lower extremity edema  LABORATORY DATA:  I have reviewed the data as listed   Chemistry      Component Value Date/Time   NA 141 10/03/2016 1256   K 4.0 10/03/2016 1256   CO2 28 10/03/2016 1256   BUN 13.6 10/03/2016 1256   CREATININE 0.7 10/03/2016 1256      Component Value Date/Time   CALCIUM 9.6 10/03/2016 1256   ALKPHOS 94 10/03/2016 1256   AST 25 10/03/2016 1256   ALT 18 10/03/2016 1256   BILITOT 0.36 10/03/2016 1256       Lab Results  Component Value Date   WBC 6.3 10/03/2016   HGB 13.6 10/03/2016   HCT 42.4 10/03/2016   MCV 85.5 10/03/2016   PLT 220 10/03/2016   NEUTROABS 3.5 10/03/2016    ASSESSMENT & PLAN:  Malignant neoplasm of upper-inner quadrant of left breast in female, estrogen receptor positive ([place]) 10/02/2018Left lumpectomy: IDC grade 1, 1.2 cm, DCIS with necrosis, margins negative, 0/2 lymph nodes negative, ER 95%, PR 40%, HER-2 negative ratio 0.84, Ki-67 3%, T1 CN 0 stage IA  Pathology counseling: I discussed the final pathology report of the patient provided  a copy of this report. I discussed the margins as well as lymph node surgeries. We also discussed the final staging along with previously performed ER/PR and HER-2/neu testing.  Treatment plan: 1. Oncotype DX testing to determine if chemotherapy would be of any benefit followed by 2. Adjuvant radiation therapy followed by 3. Adjuvant antiestrogen therapy  Return to clinic based upon Oncotype DX test result   I spent 25 minutes talking to the patient of which more than half was spent in counseling and coordination of care.  No orders of the defined types were placed in this encounter.  The patient has a good understanding of the overall plan. she agrees with it. she will call with any problems that may develop before the  next visit here.   Rulon Eisenmenger, MD 10/22/16

## 2016-10-25 ENCOUNTER — Telehealth: Payer: Self-pay | Admitting: *Deleted

## 2016-10-25 NOTE — Telephone Encounter (Signed)
Received vm call from pt stating that she had surgery 10/15/16 by Dr Barry Dienes & noticed yesterday & today that she has a tanish brown drainage in her bra.  Returned call & she states that she spoke with the triage nurse at Dr Marlowe Aschoff & she will be seen tomorrow.

## 2016-11-05 ENCOUNTER — Telehealth: Payer: Self-pay | Admitting: *Deleted

## 2016-11-05 ENCOUNTER — Encounter: Payer: Self-pay | Admitting: Radiation Oncology

## 2016-11-05 ENCOUNTER — Other Ambulatory Visit: Payer: Self-pay | Admitting: *Deleted

## 2016-11-05 DIAGNOSIS — Z170 Estrogen receptor positive status [ER+]: Principal | ICD-10-CM

## 2016-11-05 DIAGNOSIS — C50212 Malignant neoplasm of upper-inner quadrant of left female breast: Secondary | ICD-10-CM

## 2016-11-05 NOTE — Telephone Encounter (Signed)
Received notification that there was insufficient tissue to run oncotype testing. Called pt but was unable to leave a msg. Will continue to call. Referral placed for an appt with Dr. Lisbeth Renshaw for xrt.

## 2016-11-05 NOTE — Telephone Encounter (Signed)
Spoke to pt and informed d/t last of tissue to run oncotype testing. Dr. Lindi Adie does not recommend chemotherapy. Informed her next step is radiation. Received verbal understanding

## 2016-11-06 ENCOUNTER — Encounter: Payer: Self-pay | Admitting: Hematology and Oncology

## 2016-11-06 NOTE — Progress Notes (Signed)
Location of Breast Cancer:Upper-inner quadrant of left breast in female   Histology per Pathology Report   [id number] DIAGNOSIS Diagnosis  10-15-16 1. Breast, lumpectomy, Left w/seed INVASIVE DUCTAL CARCINOMA, GRADE 1, SPANNING 1.2 CM DUCTAL CARCINOMA IN SITU WITH NECROSIS ALL MARGINS OF RESECTION ARE NEGATIVE FOR CARCINOMA INVASIVE DUCTAL CARCINOMA IS 1 MM FROM INFERIOR AND MEDIAL INKED MARGIN DUCTAL CARCINOMA IN SITU IS 1 MM FROM THE MEDIAL INKED MARGIN 2. Lymph node, sentinel, biopsy, Left Axillary #1 ONE BENIGN LYMPH NODE (0/1) 3. Lymph node, sentinel, biopsy, Left Axillary #2 ONE BENIGN LYMPH NODE (0/1)  Receptor Status: ER( 95 % +), PR (40 % +), Her2-neu ( +), Ki-67 ( 3 %)   Diagnosis 09-26-16 Breast, left, needle core biopsy, 9:30 o'clock subareolar - INVASIVE DUCTAL CARCINOMA. - DUCTAL CARCINOMA IN SITU.  Receptor Status: ER( 95 % +), PR (40 % +), Her2-neu ( +), Ki-67 ( 3 %)    Did patient present with symptoms (if so, please note symptoms) or was this found on screening mammography?: Routine mammogram on 09/24/2016  Past/Anticipated interventions by surgeon, if any: 10/15/2016 Dr. Barry Dienes  Left Breast Radioactive seed localized lumpectomy and sentinel lymph node biopsy  Past/Anticipated interventions by medical oncology, if any:   10/22/2016  Dr. Nadara Eaton   Oncotype DX testing ,Adjuvant radiation therapy,Adjuvant antiestrogen therapy   Lymphedema issues, if any:  No  Pain issues, if any: No  SAFETY ISSUES:  Prior radiation? No  Pacemaker/ICD?  No    Possible current pregnancy? Is the patient on methotrexate?  No Current Complaints / other details:   menarche at age 30. She is a G3P1 with child in the early 65s. She breast fed for 7-12 months and has had some OCP use. 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  Wt Readings from Last 3 Encounters:  11/13/16 132 lb 12.8 oz (60.2 kg)  10/22/16 133 lb 14.4 oz (60.7 kg)   10/15/16 131 lb 8 oz (59.6 kg)  BP 110/75   Pulse 60   Temp 97.8 F (36.6 C) (Oral)   Resp 16   Ht _0  (1.626 m)   Wt 132 lb 12.8 oz (60.2 kg)   SpO2 96%   BMI 22.80 kg/m     Mardene Sayer, LPN 64/38/3779,3:96 PM

## 2016-11-08 ENCOUNTER — Ambulatory Visit: Payer: BC Managed Care – PPO | Attending: General Surgery | Admitting: Physical Therapy

## 2016-11-08 DIAGNOSIS — C50212 Malignant neoplasm of upper-inner quadrant of left female breast: Secondary | ICD-10-CM | POA: Insufficient documentation

## 2016-11-08 DIAGNOSIS — Z170 Estrogen receptor positive status [ER+]: Secondary | ICD-10-CM | POA: Diagnosis present

## 2016-11-08 DIAGNOSIS — R293 Abnormal posture: Secondary | ICD-10-CM | POA: Diagnosis present

## 2016-11-08 NOTE — Therapy (Signed)
[place], [place], [address] Phone: [phone]   Fax:  [phone]  Physical Therapy Treatment  Patient Details  Name: Grace Cunningham MRN: [id number] Date of Birth: 05-07-1960 Referring Provider: Dr. [name]  Encounter Date: 11/08/2016      PT End of Session - 11/08/16 1045    Visit Number 2   Number of Visits 2   PT Start Time 1012   PT Stop Time 1045   PT Time Calculation (min) 33 min   Activity Tolerance Patient tolerated treatment well   Behavior During Therapy [facility] for tasks assessed/performed      Past Medical History:  Diagnosis Date  . Anxiety   . Cancer ([place]) 09/2016   left breast cancer  . Depression   . Insomnia   . Sleep apnea    uses CPAP nightly    Past Surgical History:  Procedure Laterality Date  . APPENDECTOMY    . BREAST LUMPECTOMY WITH RADIOACTIVE SEED AND SENTINEL LYMPH NODE BIOPSY Left 10/15/2016   Procedure: LEFT BREAST LUMPECTOMY WITH RADIOACTIVE SEED AND SENTINEL LYMPH NODE BIOPSY;  Surgeon: [name], MD;  Location: [place];  Service: General;  Laterality: Left;  Marland Kitchen MANDIBULECTOMY      There were no vitals filed for this visit.      Subjective Assessment - 11/08/16 1019    Subjective Patient had a left lumpectomy and sentinel node biopsy with 2 negative nodes removed on 10/15/16. No complications after surgery. Left arm is doing well but there is some numbness on her medial left upper arm. Radiation begins sometime after her consult on 11/13/16.   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 It measured 1.2 cm and is located in the upper inner quadrant with a Ki67 of 3%. She has no other health problems.   Patient Stated Goals Make sure my arm is doing well.   Currently in Pain? Yes   Pain Score 1    Pain Location Arm   Pain Orientation Left   Pain  Descriptors / Indicators Sharp;Shooting;Numbness   Pain Type Surgical pain   Pain Onset 1 to 4 weeks ago   Pain Frequency Intermittent   Aggravating Factors  Moving my arm certain ways   Pain Relieving Factors Rest   Multiple Pain Sites No            [facility] PT Assessment - 11/08/16 0001      Assessment   Medical Diagnosis s/p left lumpectomy and SLNB   Referring Provider Dr. [name]   Onset Date/Surgical Date 10/15/16   Hand Dominance Right   Prior Therapy none     Precautions   Precautions Other (comment)   Precaution Comments active breast cancer     [place] residence   Living Arrangements Spouse/significant other   Available Help at Discharge Family     Prior Function   Level of Independence Independent   Vocation Full time employment   Vocation Requirements teaches nursing at [employer] She walks daily > 30 min and swims 1-2x/week for > 30 min     Cognition   Overall Cognitive Status Within Functional Limits for tasks assessed     Posture/Postural Control   Posture/Postural Control Postural limitations   Postural Limitations Rounded Shoulders     ROM / Strength   AROM / PROM / Strength AROM;Strength  AROM   AROM Assessment Site Shoulder;Cervical   Right/Left Shoulder Right;Left   Left Shoulder Extension 58 Degrees   Left Shoulder Flexion 149 Degrees   Left Shoulder ABduction 168 Degrees   Left Shoulder Internal Rotation 77 Degrees   Left Shoulder External Rotation 90 Degrees   Cervical Flexion WNL   Cervical Extension WNL   Cervical - Right Side Bend 25% limited   Cervical - Left Side Bend 25% limited   Cervical - Right Rotation WNL   Cervical - Left Rotation WNL     Strength   Overall Strength Within functional limits for tasks performed   Overall Strength Comments Left UE all 5/5     Palpation   Palpation comment Incision in axilla and around nipple appear to be healing well.            LYMPHEDEMA/ONCOLOGY QUESTIONNAIRE - 11/08/16 1028      Type   Cancer Type Left breast cancer     Surgeries   Lumpectomy Date 10/15/16   Sentinel Lymph Node Biopsy Date 10/15/16   Number Lymph Nodes Removed 2     Treatment   Active Chemotherapy Treatment No   Past Chemotherapy Treatment No   Active Radiation Treatment No   Past Radiation Treatment No   Current Hormone Treatment No   Past Hormone Therapy No     What other symptoms do you have   Are you Having Heaviness or Tightness No   Are you having Pain Yes   Are you having pitting edema No   Is it Hard or Difficult finding clothes that fit No   Do you have infections No   Is there Decreased scar mobility No   Stemmer Sign No     Lymphedema Assessments   Lymphedema Assessments Upper extremities     Right Upper Extremity Lymphedema   10 cm Proximal to Olecranon Process 27 cm   Olecranon Process 22.5 cm   10 cm Proximal to Ulnar Styloid Process 18.7 cm   Just Proximal to Ulnar Styloid Process 13.5 cm   Across Hand at [employer] 19 cm   At [place] of 2nd Digit 5.8 cm     Left Upper Extremity Lymphedema   10 cm Proximal to Olecranon Process 26 cm   Olecranon Process 22.5 cm   10 cm Proximal to Ulnar Styloid Process 19.2 cm   Just Proximal to Ulnar Styloid Process 13.4 cm   Across Hand at [employer] 18.5 cm   At [place] of 2nd Digit 5.4 cm                          PT Education - 11/08/16 1045    Education provided Yes   Education Details Left UE nerve stretch   Person(s) Educated Patient   Methods Explanation;Demonstration;Handout   Comprehension Returned demonstration;Verbalized understanding              Breast Clinic Goals - 10/03/16 2024      Patient will be able to verbalize understanding of pertinent lymphedema risk reduction practices relevant to her diagnosis specifically related to skin care.   Time 1   Period Days   Status Achieved     Patient will be able to return  demonstrate and/or verbalize understanding of the post-op home exercise program related to regaining shoulder range of motion.   Time 1   Period Days   Status Achieved     Patient will be  able to verbalize understanding of the importance of attending the postoperative After Breast Cancer Class for further lymphedema risk reduction education and therapeutic exercise.   Time 1   Period Days   Status Achieved              Plan - 11/08/16 1045    Clinical Impression Statement Patient is doing very well s/p left lumpectomy and sentinel node biopsy. She is scheduled for radiation consult 11/13/16 but does not need chemotherapy. She has no need for PT right now as her shoulder ROM and arm circumferential measurements appear to be abck to baseline. A left UE nerve stretch was issued to reduce her medial arm numbness. She will also attend our After Breast Cancer Class on 11/19/16.   Rehab Potential Excellent   Clinical Impairments Affecting Rehab Potential None   PT Treatment/Interventions Therapeutic exercise;Patient/family education   PT Next Visit Plan Discharge patient as there is no further need for PT   PT Home Exercise Plan Left UE nerve stretch   Consulted and Agree with Plan of Care Patient      Patient will benefit from skilled therapeutic intervention in order to improve the following deficits and impairments:  Postural dysfunction, Decreased knowledge of precautions, Pain, Impaired UE functional use, Decreased range of motion  Visit Diagnosis: Carcinoma of upper-inner quadrant of left breast in female, estrogen receptor positive ([facility])  Abnormal posture     Problem List Patient Active Problem List   Diagnosis Date Noted  . Malignant neoplasm of upper-inner quadrant of left breast in female, estrogen receptor positive ([place]) 10/02/2016   PHYSICAL THERAPY DISCHARGE SUMMARY  Visits from Start of Care: 2  Current functional level related to goals / functional  outcomes: Patient has regained full arm function and returned to baseline with shoulder ROM.   Remaining deficits: None   Education / Equipment: Education on lymphedema risk reduction and a HEP Plan: Patient agrees to discharge.  Patient goals were partially met. Patient is being discharged due to meeting the stated rehab goals.  ?????    Annia Friendly, [place] 11/08/16 10:49 AM  [place], [place], [address] Phone: [phone]   Fax:  [phone]  Name: Grace Cunningham MRN: [id number] Date of Birth: 12-14-1960

## 2016-11-08 NOTE — Addendum Note (Signed)
Addended by: Arbutus Ped C on: 11/08/2016 10:53 AM   Modules accepted: Orders

## 2016-11-08 NOTE — Patient Instructions (Signed)
MEDIAN NERVE: Mobilization XI    Stand with right palm flat on wall, fingers back, elbow bent, head tilted away. Sidestep away from wall, straightening elbow. Hold 5 seconds. Do _3 repetitions per session. Do _2__ sessions per day.  Copyright  VHI. All rights reserved.

## 2016-11-08 NOTE — Addendum Note (Signed)
Addended by: Arbutus Ped C on: 11/08/2016 11:44 AM   Modules accepted: Orders

## 2016-11-09 ENCOUNTER — Encounter (HOSPITAL_COMMUNITY): Payer: Self-pay

## 2016-11-13 ENCOUNTER — Ambulatory Visit
Admission: RE | Admit: 2016-11-13 | Discharge: 2016-11-13 | Disposition: A | Discharge status: Home / Self Care | Payer: BC Managed Care – PPO | Source: Ambulatory Visit | Attending: Radiation Oncology | Admitting: Radiation Oncology

## 2016-11-13 ENCOUNTER — Encounter: Payer: Self-pay | Admitting: Radiation Oncology

## 2016-11-13 VITALS — BP 110/75 | HR 60 | Temp 97.8°F | Resp 16 | Ht 64.0 in | Wt 132.8 lb

## 2016-11-13 DIAGNOSIS — Z800 Family history of malignant neoplasm of digestive organs: Secondary | ICD-10-CM | POA: Diagnosis not present

## 2016-11-13 DIAGNOSIS — Z510 Encounter for antineoplastic radiation therapy: Secondary | ICD-10-CM | POA: Diagnosis present

## 2016-11-13 DIAGNOSIS — Z170 Estrogen receptor positive status [ER+]: Principal | ICD-10-CM

## 2016-11-13 DIAGNOSIS — C50212 Malignant neoplasm of upper-inner quadrant of left female breast: Secondary | ICD-10-CM

## 2016-11-13 DIAGNOSIS — Z808 Family history of malignant neoplasm of other organs or systems: Secondary | ICD-10-CM | POA: Diagnosis not present

## 2016-11-13 DIAGNOSIS — Z801 Family history of malignant neoplasm of trachea, bronchus and lung: Secondary | ICD-10-CM | POA: Diagnosis not present

## 2016-11-13 DIAGNOSIS — Z9889 Other specified postprocedural states: Secondary | ICD-10-CM | POA: Diagnosis not present

## 2016-11-13 DIAGNOSIS — Z79899 Other long term (current) drug therapy: Secondary | ICD-10-CM | POA: Diagnosis not present

## 2016-11-13 DIAGNOSIS — G4700 Insomnia, unspecified: Secondary | ICD-10-CM | POA: Diagnosis not present

## 2016-11-13 NOTE — Addendum Note (Signed)
Encounter addended by: Malena Edman, RN on: 11/13/2016  3:56 PM<BR>    Actions taken: Charge Capture section accepted

## 2016-11-13 NOTE — Progress Notes (Signed)
Radiation Oncology         [phone]) [phone] ________________________________  Name: Grace Cunningham        MRN: [id number]  Date of Service: 11/13/2016 DOB: 06/04/1960  [id number], Bill Salinas, MD  Nicholas Lose, MD     REFERRING PHYSICIAN: Nicholas Lose, MD   DIAGNOSIS: The encounter diagnosis was Malignant neoplasm of upper-inner quadrant of left breast in female, estrogen receptor positive ([place]).   HISTORY OF PRESENT ILLNESS: Grace Cunningham is a 56 y.o. female originally seen in the multidisciplinary breast clinic for a new diagnosis of left breast cancer. The patient was noted to have a screening distortion on mammography of the left breast. She underwent diagnostic imaging on 09/24/16 which revealed a 1.2 x 1 x  .6 cm at 9:30. The axilla is negative for adenopathy. She underwent a biopsy on 09/26/16 which revealed a grade 1 invasive ductal carcinoma with DCIS and the tumor was ER/PR positive, HER2 negative, and Ki 67 was 3%. She underwent left breast lumpectomy with limited assessment on 10/15/2016. Pathology revealed a 1.2 cm tumor grade 1, invasive ductal carcinoma with DCIS and necrosis, her carcinoma was 1 mm from the inferior and medial margin is well as the DCIS 1 mm from the medial margin. 2 of her simple lymph nodes were negative for malignancy, and her tumor was admitted for Oncotype testing however this could not be performed due to lack of specimen. She is not planning on any chemotherapy, and comes today to discuss the options of adjuvant radiotherapy.   PREVIOUS RADIATION THERAPY: No   PAST MEDICAL HISTORY:  Past Medical History:  Diagnosis Date  . Anxiety   . Cancer ([place]) 09/2016   left breast cancer  . Depression   . Insomnia   . Sleep apnea    uses CPAP nightly       PAST SURGICAL HISTORY: Past Surgical History:  Procedure Laterality Date  . APPENDECTOMY    . BREAST LUMPECTOMY WITH RADIOACTIVE SEED AND SENTINEL LYMPH NODE BIOPSY Left 10/15/2016   Procedure:  LEFT BREAST LUMPECTOMY WITH RADIOACTIVE SEED AND SENTINEL LYMPH NODE BIOPSY;  Surgeon: [name], MD;  Location: [facility];  Service: General;  Laterality: Left;  Marland Kitchen MANDIBULECTOMY       FAMILY HISTORY:  Family History  Problem Relation Age of Onset  . Lung cancer Mother   . Skin cancer Maternal Grandmother   . Stomach cancer Maternal Grandfather   . Lung cancer Maternal Uncle      SOCIAL HISTORY:  reports that she has never smoked. She has never used smokeless tobacco. She reports that she drinks alcohol. She reports that she does not use drugs. The patient is married and lives in [place]. 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   ALLERGIES: Patient has no known allergies.   MEDICATIONS:  Current Outpatient Prescriptions  Medication Sig Dispense Refill  . ALPRAZolam (XANAX) 0.25 MG tablet Take 0.25 mg by mouth at bedtime as needed for anxiety.    . Melatonin 1 MG CAPS Take 1 capsule by mouth 3 (three) times a week.    . Multiple Vitamin (MULTIVITAMIN) tablet Take 1 tablet by mouth as needed.    Marland Kitchen oxyCODONE (OXY IR/ROXICODONE) 5 MG immediate release tablet Take 0.5-2 tablets (2.5-10 mg total) by mouth every 4 (four) hours as needed for moderate pain, severe pain or breakthrough pain. 15 tablet 0  . sertraline (ZOLOFT) 50 MG tablet Take 50 mg by mouth  daily.     No current facility-administered medications for this encounter.      REVIEW OF SYSTEMS: On review of systems, the patient reports that she is doing well overall. She denies any concerns with pain in the breasts since her surgery, and states that she never took any pain medication. She drinks a small glass of wine at night before bedtime for help falling asleep. She denies any chest pain, shortness of breath, cough, fevers, chills, night sweats, unintended weight changes. She denies any bowel or bladder disturbances, and denies abdominal pain, nausea or vomiting. She denies  any new musculoskeletal or joint aches or pains. A complete review of systems is obtained and is otherwise negative.     PHYSICAL EXAM:  Wt Readings from Last 3 Encounters:  10/22/16 133 lb 14.4 oz (60.7 kg)  10/15/16 131 lb 8 oz (59.6 kg)  10/03/16 132 lb 8 oz (60.1 kg)   Temp Readings from Last 3 Encounters:  10/22/16 98.6 F (37 C) (Oral)  10/15/16 97.8 F (36.6 C)  10/03/16 98.2 F (36.8 C) (Oral)   BP Readings from Last 3 Encounters:  10/22/16 110/66  10/15/16 123/80  10/03/16 122/81   Pulse Readings from Last 3 Encounters:  10/22/16 74  10/15/16 88  10/03/16 (!) 59    In general this is a well appearing Asian female in no acute distress. She is alert and oriented x4 and appropriate throughout the examination. HEENT reveals that the patient is normocephalic, atraumatic. EOMs are intact. PERRLA. Skin is intact without any evidence of gross lesions. Cardiovascular exam reveals a regular rate and rhythm, no clicks rubs or murmurs are auscultated. Chest is clear to auscultation bilaterally. Lymphatic assessment is performed and does not reveal any adenopathy in the cervical, supraclavicular, axillary, or inguinal chains. Bilateral breast exam is performed and reveals mild ecchymosis. She does not have any palpable mass in the breast otherwise or left breast. She does not have any nipple bleeding or discharge. Abdomen has active bowel sounds in all quadrants and is intact. The abdomen is soft, non tender, non distended. Lower extremities are negative for pretibial pitting edema, deep calf tenderness, cyanosis or clubbing.   ECOG = 0  0 - Asymptomatic (Fully active, able to carry on all predisease activities without restriction)  1 - Symptomatic but completely ambulatory (Restricted in physically strenuous activity but ambulatory and able to carry out work of a light or sedentary nature. For example, light housework, office work)  2 - Symptomatic, <50% in bed during the day  (Ambulatory and capable of all self care but unable to carry out any work activities. Up and about more than 50% of waking hours)  3 - Symptomatic, >50% in bed, but not bedbound (Capable of only limited self-care, confined to bed or chair 50% or more of waking hours)  4 - Bedbound (Completely disabled. Cannot carry on any self-care. Totally confined to bed or chair)  5 - Death   Eustace Pen MM, Creech RH, Tormey DC, et al. [id number]). "Toxicity and response criteria of the [facility] Group". [place] Oncol. 5 (6): 649-55    LABORATORY DATA:  Lab Results  Component Value Date   WBC 6.3 10/03/2016   HGB 13.6 10/03/2016   HCT 42.4 10/03/2016   MCV 85.5 10/03/2016   PLT 220 10/03/2016   Lab Results  Component Value Date   NA 141 10/03/2016   K 4.0 10/03/2016   CO2 28 10/03/2016   Lab Results  Component Value Date   ALT 18 10/03/2016   AST 25 10/03/2016   ALKPHOS 94 10/03/2016   BILITOT 0.36 10/03/2016      RADIOGRAPHY: Mm Breast Surgical Specimen  Result Date: 10/15/2016 CLINICAL DATA:  Evaluate surgical specimen following left breast lumpectomy for left breast cancer. EXAM: SPECIMEN RADIOGRAPH OF THE LEFT BREAST COMPARISON:  Previous exam(s). FINDINGS: Status post excision of the left breast. The radioactive seed and biopsy marker clip are present, completely intact, and were marked for pathology. IMPRESSION: Specimen radiograph of the left breast. Electronically Signed   By: Margarette Canada M.D.   On: 10/15/2016 08:26    IMPRESSION/PLAN: 1. Stage IA, pT1cN0Mx, grade 1 ER/PR positive invasive ductal carcinoma of the left breast with DCIS. Dr. Lisbeth Renshaw discusses the final pathology results, and reviews the nature of invasive or noninvasive breast disease. We discussed the risks, benefits, short, and long term effects of radiotherapy, and the patient is interested in proceeding. Dr. Lisbeth Renshaw recommend a course of 6-1/2 weeks with deep inspiration breath hold technique to the  left breast.  She will return for simulation on Thursday at 10 AM. Written consent is obtained and placed in the chart, a copy was provided to the patient.    In a visit lasting 25 minutes, greater than 50% of the time was spent face to face discussing her pathology and plans for treatment, and coordinating the patient's care.   The above documentation reflects my direct findings during this shared patient visit. Please see the separate note by Dr. Lisbeth Renshaw on this date for the remainder of the patient's plan of care.    Carola Rhine, PAC

## 2016-11-15 ENCOUNTER — Ambulatory Visit
Admission: RE | Admit: 2016-11-15 | Discharge: 2016-11-15 | Disposition: A | Discharge status: Home / Self Care | Payer: BC Managed Care – PPO | Source: Ambulatory Visit | Attending: Radiation Oncology | Admitting: Radiation Oncology

## 2016-11-15 DIAGNOSIS — Z510 Encounter for antineoplastic radiation therapy: Secondary | ICD-10-CM | POA: Diagnosis not present

## 2016-11-15 DIAGNOSIS — Z170 Estrogen receptor positive status [ER+]: Principal | ICD-10-CM

## 2016-11-15 DIAGNOSIS — C50212 Malignant neoplasm of upper-inner quadrant of left female breast: Secondary | ICD-10-CM

## 2016-11-15 NOTE — Progress Notes (Signed)
  Radiation Oncology         [phone]) [phone] ________________________________  Name: Grace Cunningham MRN: [id number]  Date: 11/15/2016  DOB: Sep 21, 1960  Optical Surface Tracking Plan:  Since intensity modulated radiotherapy (IMRT) and 3D conformal radiation treatment methods are predicated on accurate and precise positioning for treatment, intrafraction motion monitoring is medically necessary to ensure accurate and safe treatment delivery.  The ability to quantify intrafraction motion without excessive ionizing radiation dose can only be performed with optical surface tracking. Accordingly, surface imaging offers the opportunity to obtain 3D measurements of patient position throughout IMRT and 3D treatments without excessive radiation exposure.  I am ordering optical surface tracking for this patient's upcoming course of radiotherapy. ________________________________  Kyung Rudd, MD 11/15/2016 6:04 PM    Reference:   Ursula Alert, J, et al. Surface imaging-based analysis of intrafraction motion for breast radiotherapy patients.Journal of [place], n. 6, nov. 2014. ISSN [id number].   Available at: <http://www.jacmp.org/index.php/jacmp/article/view/4957>.

## 2016-11-15 NOTE — Progress Notes (Signed)
  Radiation Oncology         [phone]) [phone] ________________________________  Name: Grace Cunningham MRN: [id number]  Date: 11/15/2016  DOB: 06-Dec-1960  DIAGNOSIS:     ICD-10-CM   1. Malignant neoplasm of upper-inner quadrant of left breast in female, estrogen receptor positive ([place]) C50.212    Z17.0      SIMULATION AND TREATMENT PLANNING NOTE  The patient presented for simulation prior to beginning her course of radiation treatment for her diagnosis of left-sided breast cancer. The patient was placed in a supine position on a breast board. A customized vac-lock bag was constructed and this complex treatment device will be used on a daily basis during her treatment. In this fashion, a CT scan was obtained through the chest area and an isocenter was placed near the chest wall within the breast.  The patient will be planned to receive a course of radiation initially to a dose of 50.4 Gy. This will consist of a whole breast radiotherapy technique. To accomplish this, 2 customized blocks have been designed which will correspond to medial and lateral whole breast tangent fields. This treatment will be accomplished at 1.8 Gy per fraction. A forward planning technique will also be evaluated to determine if this approach improves the plan. It is anticipated that the patient will then receive a 10 Gy boost to the seroma cavity which has been contoured. This will be accomplished at 2 Gy per fraction.   This initial treatment will consist of a 3-D conformal technique. The seroma has been contoured as the primary target structure. Additionally, dose volume histograms of both this target as well as the lungs and heart will also be evaluated. Such an approach is necessary to ensure that the target area is adequately covered while the nearby critical  normal structures are adequately spared.  Plan:  The final anticipated total dose therefore will correspond to 60.4  Gy.    _______________________________   Jodelle Gross, MD, PhD

## 2016-11-20 ENCOUNTER — Telehealth: Payer: Self-pay

## 2016-11-20 NOTE — Telephone Encounter (Signed)
Prescription will be ready on Thursday for pick up.

## 2016-11-20 NOTE — Telephone Encounter (Signed)
Pt will be flying soon and she is asking for a compression sleeve for her left arm to prevent edema. Would like to pick up on Thursday during her rad onc appt.

## 2016-11-22 ENCOUNTER — Telehealth: Payer: Self-pay

## 2016-11-22 ENCOUNTER — Ambulatory Visit
Admission: RE | Admit: 2016-11-22 | Discharge: 2016-11-22 | Disposition: A | Discharge status: Home / Self Care | Payer: BC Managed Care – PPO | Source: Ambulatory Visit | Attending: Radiation Oncology | Admitting: Radiation Oncology

## 2016-11-22 DIAGNOSIS — Z510 Encounter for antineoplastic radiation therapy: Secondary | ICD-10-CM | POA: Diagnosis not present

## 2016-11-22 NOTE — Telephone Encounter (Signed)
Called to inform patient her script for compression sleeve is available for pick up. Cyndia Bent RN

## 2016-11-26 ENCOUNTER — Ambulatory Visit
Admission: RE | Admit: 2016-11-26 | Discharge: 2016-11-26 | Disposition: A | Discharge status: Home / Self Care | Payer: BC Managed Care – PPO | Source: Ambulatory Visit | Attending: Radiation Oncology | Admitting: Radiation Oncology

## 2016-11-26 DIAGNOSIS — Z510 Encounter for antineoplastic radiation therapy: Secondary | ICD-10-CM | POA: Diagnosis not present

## 2016-11-26 DIAGNOSIS — Z170 Estrogen receptor positive status [ER+]: Principal | ICD-10-CM

## 2016-11-26 DIAGNOSIS — C50212 Malignant neoplasm of upper-inner quadrant of left female breast: Secondary | ICD-10-CM

## 2016-11-26 MED ORDER — RADIAPLEXRX EX GEL
Freq: Once | CUTANEOUS | Status: AC
  Administered 2016-11-26: 13:00:00 via TOPICAL

## 2016-11-26 MED ORDER — ALRA NON-METALLIC DEODORANT (RAD-ONC)
1.0000 "application " | Freq: Once | TOPICAL | Status: AC
  Administered 2016-11-26: 1 via TOPICAL

## 2016-11-26 NOTE — Progress Notes (Signed)
Patient education done, my business card, Alra deodorant  and radiaplex given, discussed side effects, skin irritation,pain, swelling, of breast, fatigue, apply radiaplex after rad tx and bedtime daily for moisturizing and soothing skin, alra  after rad tx and prn, luke warm shower/bath,no rubbing,scrubbing, or scratching treated area, unscented soap such as dove,mild, pat dry, no under wire bra if possible, electric shaver only for axilla increase protein in diet, stay hydrated,drink plenty water, sees MD weekly and prn, teach back given

## 2016-11-27 ENCOUNTER — Ambulatory Visit
Admission: RE | Admit: 2016-11-27 | Discharge: 2016-11-27 | Disposition: A | Discharge status: Home / Self Care | Payer: BC Managed Care – PPO | Source: Ambulatory Visit | Attending: Radiation Oncology | Admitting: Radiation Oncology

## 2016-11-27 DIAGNOSIS — Z510 Encounter for antineoplastic radiation therapy: Secondary | ICD-10-CM | POA: Diagnosis not present

## 2016-11-28 ENCOUNTER — Ambulatory Visit
Admission: RE | Admit: 2016-11-28 | Discharge: 2016-11-28 | Disposition: A | Discharge status: Home / Self Care | Payer: BC Managed Care – PPO | Source: Ambulatory Visit | Attending: Radiation Oncology | Admitting: Radiation Oncology

## 2016-11-28 DIAGNOSIS — Z510 Encounter for antineoplastic radiation therapy: Secondary | ICD-10-CM | POA: Diagnosis not present

## 2016-11-29 ENCOUNTER — Ambulatory Visit
Admission: RE | Admit: 2016-11-29 | Discharge: 2016-11-29 | Disposition: A | Discharge status: Home / Self Care | Payer: BC Managed Care – PPO | Source: Ambulatory Visit | Attending: Radiation Oncology | Admitting: Radiation Oncology

## 2016-11-29 DIAGNOSIS — Z510 Encounter for antineoplastic radiation therapy: Secondary | ICD-10-CM | POA: Diagnosis not present

## 2016-11-30 ENCOUNTER — Ambulatory Visit
Admission: RE | Admit: 2016-11-30 | Discharge: 2016-11-30 | Disposition: A | Discharge status: Home / Self Care | Payer: BC Managed Care – PPO | Source: Ambulatory Visit | Attending: Radiation Oncology | Admitting: Radiation Oncology

## 2016-11-30 DIAGNOSIS — Z510 Encounter for antineoplastic radiation therapy: Secondary | ICD-10-CM | POA: Diagnosis not present

## 2016-12-02 ENCOUNTER — Ambulatory Visit
Admission: RE | Admit: 2016-12-02 | Discharge: 2016-12-02 | Disposition: A | Discharge status: Home / Self Care | Payer: BC Managed Care – PPO | Source: Ambulatory Visit | Attending: Radiation Oncology | Admitting: Radiation Oncology

## 2016-12-02 DIAGNOSIS — Z510 Encounter for antineoplastic radiation therapy: Secondary | ICD-10-CM | POA: Diagnosis not present

## 2016-12-03 ENCOUNTER — Ambulatory Visit
Admission: RE | Admit: 2016-12-03 | Discharge: 2016-12-03 | Disposition: A | Discharge status: Home / Self Care | Payer: BC Managed Care – PPO | Source: Ambulatory Visit | Attending: Radiation Oncology | Admitting: Radiation Oncology

## 2016-12-03 DIAGNOSIS — Z510 Encounter for antineoplastic radiation therapy: Secondary | ICD-10-CM | POA: Diagnosis not present

## 2016-12-04 ENCOUNTER — Ambulatory Visit
Admission: RE | Admit: 2016-12-04 | Discharge: 2016-12-04 | Disposition: A | Discharge status: Home / Self Care | Payer: BC Managed Care – PPO | Source: Ambulatory Visit | Attending: Radiation Oncology | Admitting: Radiation Oncology

## 2016-12-04 DIAGNOSIS — Z510 Encounter for antineoplastic radiation therapy: Secondary | ICD-10-CM | POA: Diagnosis not present

## 2016-12-05 ENCOUNTER — Ambulatory Visit
Admission: RE | Admit: 2016-12-05 | Discharge: 2016-12-05 | Disposition: A | Discharge status: Home / Self Care | Payer: BC Managed Care – PPO | Source: Ambulatory Visit | Attending: Radiation Oncology | Admitting: Radiation Oncology

## 2016-12-05 DIAGNOSIS — Z510 Encounter for antineoplastic radiation therapy: Secondary | ICD-10-CM | POA: Diagnosis not present

## 2016-12-10 ENCOUNTER — Ambulatory Visit
Admission: RE | Admit: 2016-12-10 | Discharge: 2016-12-10 | Disposition: A | Discharge status: Home / Self Care | Payer: BC Managed Care – PPO | Source: Ambulatory Visit | Attending: Radiation Oncology | Admitting: Radiation Oncology

## 2016-12-10 DIAGNOSIS — Z510 Encounter for antineoplastic radiation therapy: Secondary | ICD-10-CM | POA: Diagnosis not present

## 2016-12-11 ENCOUNTER — Ambulatory Visit
Admission: RE | Admit: 2016-12-11 | Discharge: 2016-12-11 | Disposition: A | Discharge status: Home / Self Care | Payer: BC Managed Care – PPO | Source: Ambulatory Visit | Attending: Radiation Oncology | Admitting: Radiation Oncology

## 2016-12-11 DIAGNOSIS — Z510 Encounter for antineoplastic radiation therapy: Secondary | ICD-10-CM | POA: Diagnosis not present

## 2016-12-12 ENCOUNTER — Ambulatory Visit
Admission: RE | Admit: 2016-12-12 | Discharge: 2016-12-12 | Disposition: A | Discharge status: Home / Self Care | Payer: BC Managed Care – PPO | Source: Ambulatory Visit | Attending: Radiation Oncology | Admitting: Radiation Oncology

## 2016-12-12 DIAGNOSIS — Z510 Encounter for antineoplastic radiation therapy: Secondary | ICD-10-CM | POA: Diagnosis not present

## 2016-12-13 ENCOUNTER — Ambulatory Visit
Admission: RE | Admit: 2016-12-13 | Discharge: 2016-12-13 | Disposition: A | Discharge status: Home / Self Care | Payer: BC Managed Care – PPO | Source: Ambulatory Visit | Attending: Radiation Oncology | Admitting: Radiation Oncology

## 2016-12-13 DIAGNOSIS — Z510 Encounter for antineoplastic radiation therapy: Secondary | ICD-10-CM | POA: Diagnosis not present

## 2016-12-14 ENCOUNTER — Ambulatory Visit
Admission: RE | Admit: 2016-12-14 | Discharge: 2016-12-14 | Disposition: A | Discharge status: Home / Self Care | Payer: BC Managed Care – PPO | Source: Ambulatory Visit | Attending: Radiation Oncology | Admitting: Radiation Oncology

## 2016-12-14 DIAGNOSIS — Z510 Encounter for antineoplastic radiation therapy: Secondary | ICD-10-CM | POA: Diagnosis not present

## 2016-12-17 ENCOUNTER — Ambulatory Visit
Admission: RE | Admit: 2016-12-17 | Discharge: 2016-12-17 | Disposition: A | Discharge status: Home / Self Care | Payer: BC Managed Care – PPO | Source: Ambulatory Visit | Attending: Radiation Oncology | Admitting: Radiation Oncology

## 2016-12-17 DIAGNOSIS — Z510 Encounter for antineoplastic radiation therapy: Secondary | ICD-10-CM | POA: Diagnosis not present

## 2016-12-18 ENCOUNTER — Ambulatory Visit
Admission: RE | Admit: 2016-12-18 | Discharge: 2016-12-18 | Disposition: A | Discharge status: Home / Self Care | Payer: BC Managed Care – PPO | Source: Ambulatory Visit | Attending: Radiation Oncology | Admitting: Radiation Oncology

## 2016-12-18 DIAGNOSIS — Z510 Encounter for antineoplastic radiation therapy: Secondary | ICD-10-CM | POA: Diagnosis not present

## 2016-12-19 ENCOUNTER — Ambulatory Visit
Admission: RE | Admit: 2016-12-19 | Discharge: 2016-12-19 | Disposition: A | Discharge status: Home / Self Care | Payer: BC Managed Care – PPO | Source: Ambulatory Visit | Attending: Radiation Oncology | Admitting: Radiation Oncology

## 2016-12-19 DIAGNOSIS — Z510 Encounter for antineoplastic radiation therapy: Secondary | ICD-10-CM | POA: Diagnosis not present

## 2016-12-20 ENCOUNTER — Ambulatory Visit
Admission: RE | Admit: 2016-12-20 | Discharge: 2016-12-20 | Disposition: A | Discharge status: Home / Self Care | Payer: BC Managed Care – PPO | Source: Ambulatory Visit | Attending: Radiation Oncology | Admitting: Radiation Oncology

## 2016-12-20 DIAGNOSIS — Z510 Encounter for antineoplastic radiation therapy: Secondary | ICD-10-CM | POA: Diagnosis not present

## 2016-12-21 ENCOUNTER — Ambulatory Visit
Admission: RE | Admit: 2016-12-21 | Discharge: 2016-12-21 | Disposition: A | Discharge status: Home / Self Care | Payer: BC Managed Care – PPO | Source: Ambulatory Visit | Attending: Radiation Oncology | Admitting: Radiation Oncology

## 2016-12-21 DIAGNOSIS — Z510 Encounter for antineoplastic radiation therapy: Secondary | ICD-10-CM | POA: Diagnosis not present

## 2016-12-24 ENCOUNTER — Ambulatory Visit: Payer: BC Managed Care – PPO

## 2016-12-25 ENCOUNTER — Ambulatory Visit
Admission: RE | Admit: 2016-12-25 | Discharge: 2016-12-25 | Disposition: A | Discharge status: Home / Self Care | Payer: BC Managed Care – PPO | Source: Ambulatory Visit | Attending: Radiation Oncology | Admitting: Radiation Oncology

## 2016-12-25 DIAGNOSIS — Z510 Encounter for antineoplastic radiation therapy: Secondary | ICD-10-CM | POA: Diagnosis not present

## 2016-12-26 ENCOUNTER — Ambulatory Visit
Admission: RE | Admit: 2016-12-26 | Discharge: 2016-12-26 | Disposition: A | Discharge status: Home / Self Care | Payer: BC Managed Care – PPO | Source: Ambulatory Visit | Attending: Radiation Oncology | Admitting: Radiation Oncology

## 2016-12-26 DIAGNOSIS — Z510 Encounter for antineoplastic radiation therapy: Secondary | ICD-10-CM | POA: Diagnosis not present

## 2016-12-27 ENCOUNTER — Ambulatory Visit
Admission: RE | Admit: 2016-12-27 | Discharge: 2016-12-27 | Disposition: A | Discharge status: Home / Self Care | Payer: BC Managed Care – PPO | Source: Ambulatory Visit | Attending: Radiation Oncology | Admitting: Radiation Oncology

## 2016-12-27 DIAGNOSIS — Z510 Encounter for antineoplastic radiation therapy: Secondary | ICD-10-CM | POA: Diagnosis not present

## 2016-12-28 ENCOUNTER — Ambulatory Visit: Payer: BC Managed Care – PPO | Admitting: Radiation Oncology

## 2016-12-28 ENCOUNTER — Ambulatory Visit
Admission: RE | Admit: 2016-12-28 | Discharge: 2016-12-28 | Disposition: A | Discharge status: Home / Self Care | Payer: BC Managed Care – PPO | Source: Ambulatory Visit | Attending: Radiation Oncology | Admitting: Radiation Oncology

## 2016-12-28 DIAGNOSIS — Z510 Encounter for antineoplastic radiation therapy: Secondary | ICD-10-CM | POA: Diagnosis not present

## 2016-12-29 ENCOUNTER — Ambulatory Visit
Admission: RE | Admit: 2016-12-29 | Discharge: 2016-12-29 | Disposition: A | Discharge status: Home / Self Care | Payer: BC Managed Care – PPO | Source: Ambulatory Visit | Attending: Radiation Oncology | Admitting: Radiation Oncology

## 2016-12-29 DIAGNOSIS — Z510 Encounter for antineoplastic radiation therapy: Secondary | ICD-10-CM | POA: Diagnosis not present

## 2016-12-31 ENCOUNTER — Ambulatory Visit
Admission: RE | Admit: 2016-12-31 | Discharge: 2016-12-31 | Disposition: A | Discharge status: Home / Self Care | Payer: BC Managed Care – PPO | Source: Ambulatory Visit | Attending: Radiation Oncology | Admitting: Radiation Oncology

## 2016-12-31 ENCOUNTER — Ambulatory Visit: Payer: BC Managed Care – PPO

## 2016-12-31 DIAGNOSIS — Z510 Encounter for antineoplastic radiation therapy: Secondary | ICD-10-CM | POA: Diagnosis not present

## 2017-01-01 ENCOUNTER — Ambulatory Visit: Payer: BC Managed Care – PPO

## 2017-01-01 ENCOUNTER — Ambulatory Visit
Admission: RE | Admit: 2017-01-01 | Discharge: 2017-01-01 | Disposition: A | Discharge status: Home / Self Care | Payer: BC Managed Care – PPO | Source: Ambulatory Visit | Attending: Radiation Oncology | Admitting: Radiation Oncology

## 2017-01-01 DIAGNOSIS — C50212 Malignant neoplasm of upper-inner quadrant of left female breast: Secondary | ICD-10-CM | POA: Insufficient documentation

## 2017-01-01 DIAGNOSIS — Z510 Encounter for antineoplastic radiation therapy: Secondary | ICD-10-CM | POA: Diagnosis present

## 2017-01-02 ENCOUNTER — Ambulatory Visit
Admission: RE | Admit: 2017-01-02 | Discharge: 2017-01-02 | Disposition: A | Discharge status: Home / Self Care | Payer: BC Managed Care – PPO | Source: Ambulatory Visit | Attending: Radiation Oncology | Admitting: Radiation Oncology

## 2017-01-02 ENCOUNTER — Ambulatory Visit: Payer: BC Managed Care – PPO

## 2017-01-02 DIAGNOSIS — Z510 Encounter for antineoplastic radiation therapy: Secondary | ICD-10-CM | POA: Diagnosis not present

## 2017-01-03 ENCOUNTER — Ambulatory Visit: Payer: BC Managed Care – PPO

## 2017-01-03 ENCOUNTER — Ambulatory Visit
Admission: RE | Admit: 2017-01-03 | Discharge: 2017-01-03 | Disposition: A | Discharge status: Home / Self Care | Payer: BC Managed Care – PPO | Source: Ambulatory Visit | Attending: Radiation Oncology | Admitting: Radiation Oncology

## 2017-01-03 DIAGNOSIS — Z510 Encounter for antineoplastic radiation therapy: Secondary | ICD-10-CM | POA: Diagnosis not present

## 2017-01-04 ENCOUNTER — Ambulatory Visit: Payer: BC Managed Care – PPO | Admitting: Radiation Oncology

## 2017-01-04 ENCOUNTER — Ambulatory Visit
Admission: RE | Admit: 2017-01-04 | Discharge: 2017-01-04 | Disposition: A | Discharge status: Home / Self Care | Payer: BC Managed Care – PPO | Source: Ambulatory Visit | Attending: Radiation Oncology | Admitting: Radiation Oncology

## 2017-01-04 ENCOUNTER — Ambulatory Visit: Payer: BC Managed Care – PPO

## 2017-01-04 DIAGNOSIS — Z510 Encounter for antineoplastic radiation therapy: Secondary | ICD-10-CM | POA: Diagnosis not present

## 2017-01-07 ENCOUNTER — Ambulatory Visit: Payer: BC Managed Care – PPO

## 2017-01-07 ENCOUNTER — Ambulatory Visit
Admission: RE | Admit: 2017-01-07 | Discharge: 2017-01-07 | Disposition: A | Discharge status: Home / Self Care | Payer: BC Managed Care – PPO | Source: Ambulatory Visit | Attending: Radiation Oncology | Admitting: Radiation Oncology

## 2017-01-07 DIAGNOSIS — Z510 Encounter for antineoplastic radiation therapy: Secondary | ICD-10-CM | POA: Diagnosis not present

## 2017-01-09 ENCOUNTER — Ambulatory Visit
Admission: RE | Admit: 2017-01-09 | Discharge: 2017-01-09 | Disposition: A | Discharge status: Home / Self Care | Payer: BC Managed Care – PPO | Source: Ambulatory Visit | Attending: Radiation Oncology | Admitting: Radiation Oncology

## 2017-01-09 ENCOUNTER — Ambulatory Visit: Payer: BC Managed Care – PPO

## 2017-01-09 DIAGNOSIS — Z510 Encounter for antineoplastic radiation therapy: Secondary | ICD-10-CM | POA: Diagnosis not present

## 2017-01-10 ENCOUNTER — Ambulatory Visit: Payer: BC Managed Care – PPO

## 2017-01-10 ENCOUNTER — Ambulatory Visit
Admission: RE | Admit: 2017-01-10 | Discharge: 2017-01-10 | Disposition: A | Discharge status: Home / Self Care | Payer: BC Managed Care – PPO | Source: Ambulatory Visit | Attending: Radiation Oncology | Admitting: Radiation Oncology

## 2017-01-10 DIAGNOSIS — Z510 Encounter for antineoplastic radiation therapy: Secondary | ICD-10-CM | POA: Diagnosis not present

## 2017-01-11 ENCOUNTER — Encounter: Payer: Self-pay | Admitting: Radiation Oncology

## 2017-01-11 ENCOUNTER — Ambulatory Visit
Admission: RE | Admit: 2017-01-11 | Discharge: 2017-01-11 | Disposition: A | Discharge status: Home / Self Care | Payer: BC Managed Care – PPO | Source: Ambulatory Visit | Attending: Radiation Oncology | Admitting: Radiation Oncology

## 2017-01-11 ENCOUNTER — Ambulatory Visit: Payer: BC Managed Care – PPO

## 2017-01-11 DIAGNOSIS — Z510 Encounter for antineoplastic radiation therapy: Secondary | ICD-10-CM | POA: Diagnosis not present

## 2017-01-14 ENCOUNTER — Ambulatory Visit: Payer: BC Managed Care – PPO

## 2017-01-24 NOTE — Progress Notes (Signed)
  Radiation Oncology         [phone]) [phone] ________________________________  Name: Grace Cunningham MRN: [id number]  Date: 01/11/2017  DOB: 25-Nov-1960  End of Treatment Note  Diagnosis:     Stage IA, pT1cN0Mx, grade 1 ER/PR positive invasive ductal carcinoma of the left breast with DCIS  Indication for treatment:  Curative       Radiation treatment dates:   11/26/2016 - 01/11/2017  Site/dose:   The patient initially received a dose of 50.4 Gy in 28 fractions to the breast using whole-breast tangent fields. This was delivered using a 3-D conformal technique. The patient then received a boost to the seroma. This delivered an additional 10 Gy in 5 fractions using a 3D technique. The total dose was 60.4 Gy.  Narrative: The patient tolerated radiation treatment relatively well.   The patient had some expected fatigue and skin irritation as she progressed during treatment. Moist desquamation was not present at the end of treatment.  Plan: The patient has completed radiation treatment. The patient will return to radiation oncology clinic for routine followup in one month. I advised the patient to call or return sooner if they have any questions or concerns related to their recovery or treatment. ________________________________  Jodelle Gross, MD, PhD  This document serves as a record of services personally performed by Kyung Rudd, MD. It was created on his behalf by Rae Lips, a trained medical scribe. The creation of this record is based on the scribe's personal observations and the provider's statements to them. This document has been checked and approved by the attending provider.

## 2017-02-12 ENCOUNTER — Encounter: Payer: Self-pay | Admitting: Radiation Oncology

## 2017-02-12 ENCOUNTER — Other Ambulatory Visit: Payer: Self-pay

## 2017-02-12 ENCOUNTER — Ambulatory Visit
Admission: RE | Admit: 2017-02-12 | Discharge: 2017-02-12 | Disposition: A | Discharge status: Home / Self Care | Payer: BC Managed Care – PPO | Source: Ambulatory Visit | Attending: Radiation Oncology | Admitting: Radiation Oncology

## 2017-02-12 VITALS — BP 106/69 | HR 62 | Temp 97.6°F | Resp 18 | Ht 64.0 in | Wt 139.6 lb

## 2017-02-12 DIAGNOSIS — Z79899 Other long term (current) drug therapy: Secondary | ICD-10-CM | POA: Diagnosis not present

## 2017-02-12 DIAGNOSIS — Z923 Personal history of irradiation: Secondary | ICD-10-CM | POA: Diagnosis not present

## 2017-02-12 DIAGNOSIS — C50912 Malignant neoplasm of unspecified site of left female breast: Secondary | ICD-10-CM | POA: Insufficient documentation

## 2017-02-12 DIAGNOSIS — C50212 Malignant neoplasm of upper-inner quadrant of left female breast: Secondary | ICD-10-CM

## 2017-02-12 DIAGNOSIS — Z170 Estrogen receptor positive status [ER+]: Secondary | ICD-10-CM | POA: Diagnosis not present

## 2017-02-12 NOTE — Addendum Note (Signed)
Encounter addended by: Malena Edman, RN on: 02/12/2017 2:56 PM  Actions taken: Charge Capture section accepted

## 2017-02-12 NOTE — Progress Notes (Signed)
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 This was delivered using a 3-D conformal technique. The patient then received a boost to the seroma. This delivered an additional 10 Gy in 5 fractions using a 3D technique. The total dose was 60.4 Gy.   Narrative:  The patient returns today for routine follow-up. During treatment she did very well with radiotherapy and did not have significant desquamation. She has not seen Dr. Lindi Adie since October 2018.                           On review of systems, the patient states she is doing very well. She denies any concerns with her skin at this point. She has been working since 4 days postop. No other complaints are noted.   ALLERGIES:  has No Known Allergies.  Meds: Current Outpatient Medications  Medication Sig Dispense Refill  . hyaluronate sodium (RADIAPLEXRX) GEL Apply 1 application 2 (two) times daily topically. Apply after rad tx to breast  and bedtime, nothing 4 hours prior to rad tx    . Melatonin 1 MG CAPS Take 1 capsule by mouth 3 (three) times a week.    . Multiple Vitamin (MULTIVITAMIN) tablet Take 1 tablet by mouth as needed.    . sertraline (ZOLOFT) 50 MG tablet Take 50 mg by mouth daily.    Marland Kitchen ALPRAZolam (XANAX) 0.25 MG tablet Take 0.25 mg by mouth at bedtime as needed for anxiety.    Marland Kitchen oxyCODONE (OXY IR/ROXICODONE) 5 MG immediate release tablet Take 0.5-2 tablets (2.5-10 mg total) by mouth every 4 (four) hours as needed for moderate pain, severe pain or  breakthrough pain. (Patient not taking: Reported on 11/13/2016) 15 tablet 0   No current facility-administered medications for this encounter.     Physical Findings:  height is 5\' 4"  (1.626 m) and weight is 139 lb 9.6 oz (63.3 kg). Her oral temperature is 97.6 F (36.4 C). Her blood pressure is 106/69 and her pulse is 62. Her respiration is 18 and oxygen saturation is 99%.  Pain Assessment Pain Score: 0-No pain/10 In general this is a well appearing Asian female in no acute distress. She's alert and oriented x4 and appropriate throughout the examination. Cardiopulmonary assessment is negative for acute distress and she exhibits normal effort. The left breast was examined and reveals mild hyperpigmentation without desquamation.   Lab Findings: Lab Results  Component Value Date   WBC 6.3 10/03/2016   HGB 13.6 10/03/2016   HCT 42.4 10/03/2016   MCV 85.5 10/03/2016   PLT 220 10/03/2016     Radiographic Findings: No results found.  Impression/Plan: 1. 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 The patient has been doing well since completion of radiotherapy. We discussed that we would be happy to continue to follow her as needed, but she will also continue to follow up with Dr. Lindi Adie in medical oncology. She was counseled on skin care  as well as measures to avoid sun exposure to this area.  2. Survivorship. We reviewed the resources here at the cancer center and will be      Carola Rhine, [facility]

## 2017-02-13 ENCOUNTER — Telehealth: Payer: Self-pay | Admitting: Hematology and Oncology

## 2017-02-13 NOTE — Telephone Encounter (Signed)
Spoke to patient regarding upcoming February appointments per 1/29 sch message.

## 2017-02-18 ENCOUNTER — Telehealth: Payer: Self-pay | Admitting: Hematology and Oncology

## 2017-02-18 ENCOUNTER — Inpatient Hospital Stay: Payer: BC Managed Care – PPO | Attending: Hematology and Oncology | Admitting: Hematology and Oncology

## 2017-02-18 DIAGNOSIS — Z79811 Long term (current) use of aromatase inhibitors: Secondary | ICD-10-CM

## 2017-02-18 DIAGNOSIS — Z170 Estrogen receptor positive status [ER+]: Secondary | ICD-10-CM

## 2017-02-18 DIAGNOSIS — Z79899 Other long term (current) drug therapy: Secondary | ICD-10-CM

## 2017-02-18 DIAGNOSIS — C50212 Malignant neoplasm of upper-inner quadrant of left female breast: Secondary | ICD-10-CM

## 2017-02-18 DIAGNOSIS — Z923 Personal history of irradiation: Secondary | ICD-10-CM

## 2017-02-18 MED ORDER — LETROZOLE 2.5 MG PO TABS: 2.5000 mg | ORAL_TABLET | Freq: Every day | ORAL | 3 refills | Status: DC

## 2017-02-18 NOTE — Progress Notes (Signed)
Patient Care Team: Rankins, Bill Salinas, MD as PCP - General (Family Medicine) [name], MD as Consulting Physician (General Surgery) Nicholas Lose, MD as Consulting Physician (Hematology and Oncology) Kyung Rudd, MD as Consulting Physician (Radiation Oncology)  DIAGNOSIS:  Encounter Diagnosis  Name Primary?  . Malignant neoplasm of upper-inner quadrant of left breast in female, estrogen receptor positive ([place])     SUMMARY OF ONCOLOGIC HISTORY:   Malignant neoplasm of upper-inner quadrant of left breast in female, estrogen receptor positive ([place])   09/26/2016 Initial Diagnosis    Screening detected distortion in the left breast retroareolar region at 9:30 position 1.2 cm, axilla negative. Biopsy revealed grade 1 invasive ductal carcinoma with DCIS ER 95%, PR 40%, HER-2 negative ratio 0.84, Ki-67 3%, T1c N0 stage IA AJCC 8      10/16/2016 Surgery    Left lumpectomy: IDC grade 1, 1.2 cm, DCIS with necrosis, margins negative, 0/2 lymph nodes negative, ER 95%, PR 40%, HER-2 negative ratio 0.84, Ki-67 3%, T1 CN 0 stage IA      11/26/2016 - 01/11/2017 Radiation Therapy    Adjuvant radiation therapy       CHIEF COMPLIANT: Follow-up after adjuvant radiation therapy  INTERVAL HISTORY: Grace Cunningham is a 12-year with above-mentioned history of breast cancer treated with lumpectomy and radiation is here to discuss adjuvant treatment plan.  She has recovered from the effects of radiation.  In fact she did not have much trouble with radiation therapy.  REVIEW OF SYSTEMS:   Constitutional: Denies fevers, chills or abnormal weight loss Eyes: Denies blurriness of vision Ears, nose, mouth, throat, and face: Denies mucositis or sore throat Respiratory: Denies cough, dyspnea or wheezes Cardiovascular: Denies palpitation, chest discomfort Gastrointestinal:  Denies nausea, heartburn or change in bowel habits Skin: Denies abnormal skin rashes Lymphatics: Denies new lymphadenopathy or  easy bruising Neurological:Denies numbness, tingling or new weaknesses Behavioral/Psych: Mood is stable, no new changes  Extremities: No lower extremity edema  All other systems were reviewed with the patient and are negative.  I have reviewed the past medical history, past surgical history, social history and family history with the patient and they are unchanged from previous note.  ALLERGIES:  has No Known Allergies.  MEDICATIONS:  Current Outpatient Medications  Medication Sig Dispense Refill  . ALPRAZolam (XANAX) 0.25 MG tablet Take 0.25 mg by mouth at bedtime as needed for anxiety.    . hyaluronate sodium (RADIAPLEXRX) GEL Apply 1 application 2 (two) times daily topically. Apply after rad tx to breast  and bedtime, nothing 4 hours prior to rad tx    . Melatonin 1 MG CAPS Take 1 capsule by mouth 3 (three) times a week.    . Multiple Vitamin (MULTIVITAMIN) tablet Take 1 tablet by mouth as needed.    Marland Kitchen oxyCODONE (OXY IR/ROXICODONE) 5 MG immediate release tablet Take 0.5-2 tablets (2.5-10 mg total) by mouth every 4 (four) hours as needed for moderate pain, severe pain or breakthrough pain. (Patient not taking: Reported on 11/13/2016) 15 tablet 0  . sertraline (ZOLOFT) 50 MG tablet Take 50 mg by mouth daily.     No current facility-administered medications for this visit.     PHYSICAL EXAMINATION: ECOG PERFORMANCE STATUS: 1 - Symptomatic but completely ambulatory  Vitals:   02/18/17 1002  BP: 106/75  Pulse: 64  Resp: 18  Temp: 98.6 F (37 C)  SpO2: 98%   Filed Weights   02/18/17 1002  Weight: 138 lb 8 oz (62.8 kg)  GENERAL:alert, no distress and comfortable SKIN: skin color, texture, turgor are normal, no rashes or significant lesions EYES: normal, Conjunctiva are pink and non-injected, sclera clear OROPHARYNX:no exudate, no erythema and lips, buccal mucosa, and tongue normal  NECK: supple, thyroid normal size, non-tender, without nodularity LYMPH:  no palpable  lymphadenopathy in the cervical, axillary or inguinal LUNGS: clear to auscultation and percussion with normal breathing effort HEART: regular rate & rhythm and no murmurs and no lower extremity edema ABDOMEN:abdomen soft, non-tender and normal bowel sounds MUSCULOSKELETAL:no cyanosis of digits and no clubbing  NEURO: alert & oriented x 3 with fluent speech, no focal motor/sensory deficits EXTREMITIES: No lower extremity edema  LABORATORY DATA:  I have reviewed the data as listed CMP Latest Ref Rng & Units 10/03/2016  Glucose 70 - 140 mg/dl 77  BUN 7.0 - 26.0 mg/dL 13.6  Creatinine 0.6 - 1.1 mg/dL 0.7  Sodium 136 - 145 mEq/L 141  Potassium 3.5 - 5.1 mEq/L 4.0  CO2 22 - 29 mEq/L 28  Calcium 8.4 - 10.4 mg/dL 9.6  Total Protein 6.4 - 8.3 g/dL 7.6  Total Bilirubin 0.20 - 1.20 mg/dL 0.36  Alkaline Phos 40 - 150 U/L 94  AST 5 - 34 U/L 25  ALT 0 - 55 U/L 18    Lab Results  Component Value Date   WBC 6.3 10/03/2016   HGB 13.6 10/03/2016   HCT 42.4 10/03/2016   MCV 85.5 10/03/2016   PLT 220 10/03/2016   NEUTROABS 3.5 10/03/2016    ASSESSMENT & PLAN:  Malignant neoplasm of upper-inner quadrant of left breast in female, estrogen receptor positive ([place]) 10/02/2018Left lumpectomy: IDC grade 1, 1.2 cm, DCIS with necrosis, margins negative, 0/2 lymph nodes negative, ER 95%, PR 40%, HER-2 negative ratio 0.84, Ki-67 3%, T1 CN 0 stage IA; insufficient tumor to do Oncotype testing Adjuvant radiation 11/26/2016 -01/11/2017  Treatment plan: Adjuvant antiestrogen therapy with letrozole 2.5 mg daily Letrozole counseling:We discussed the risks and benefits of anti-estrogen therapy with aromatase inhibitors. These include but not limited to insomnia, hot flashes, mood changes, vaginal dryness, bone density loss, and weight gain. We strongly believe that the benefits far outweigh the risks. Patient understands these risks and consented to starting treatment. Planned treatment duration is 7  years.  Return to clinic in 3 months for follow-up with survivorship care plan visit     I spent 25 minutes talking to the patient of which more than half was spent in counseling and coordination of care.  No orders of the defined types were placed in this encounter.  The patient has a good understanding of the overall plan. she agrees with it. she will call with any problems that may develop before the next visit here.   Harriette Ohara, MD 02/18/17

## 2017-02-18 NOTE — Telephone Encounter (Signed)
Gave avs and calendar for may  °

## 2017-02-18 NOTE — Assessment & Plan Note (Signed)
10/02/2018Left lumpectomy: IDC grade 1, 1.2 cm, DCIS with necrosis, margins negative, 0/2 lymph nodes negative, ER 95%, PR 40%, HER-2 negative ratio 0.84, Ki-67 3%, T1 CN 0 stage IA; insufficient tumor to do Oncotype testing Adjuvant radiation 11/26/2016 -01/11/2017  Treatment plan: Adjuvant antiestrogen therapy with letrozole 2.5 mg daily Letrozole counseling:We discussed the risks and benefits of anti-estrogen therapy with aromatase inhibitors. These include but not limited to insomnia, hot flashes, mood changes, vaginal dryness, bone density loss, and weight gain. We strongly believe that the benefits far outweigh the risks. Patient understands these risks and consented to starting treatment. Planned treatment duration is 7 years.  Return to clinic in 3 months for follow-up with survivorship care plan visit

## 2017-05-17 ENCOUNTER — Telehealth: Payer: Self-pay | Admitting: Adult Health

## 2017-05-17 ENCOUNTER — Encounter: Payer: Self-pay | Admitting: Adult Health

## 2017-05-17 ENCOUNTER — Inpatient Hospital Stay: Payer: BC Managed Care – PPO | Attending: Hematology and Oncology | Admitting: Adult Health

## 2017-05-17 VITALS — BP 112/76 | HR 67 | Temp 98.6°F | Resp 18 | Ht 64.0 in | Wt 136.9 lb

## 2017-05-17 DIAGNOSIS — Z79899 Other long term (current) drug therapy: Secondary | ICD-10-CM | POA: Diagnosis not present

## 2017-05-17 DIAGNOSIS — Z79811 Long term (current) use of aromatase inhibitors: Secondary | ICD-10-CM | POA: Insufficient documentation

## 2017-05-17 DIAGNOSIS — E2839 Other primary ovarian failure: Secondary | ICD-10-CM

## 2017-05-17 DIAGNOSIS — Z801 Family history of malignant neoplasm of trachea, bronchus and lung: Secondary | ICD-10-CM | POA: Insufficient documentation

## 2017-05-17 DIAGNOSIS — Z923 Personal history of irradiation: Secondary | ICD-10-CM | POA: Diagnosis not present

## 2017-05-17 DIAGNOSIS — Z170 Estrogen receptor positive status [ER+]: Secondary | ICD-10-CM

## 2017-05-17 DIAGNOSIS — Z800 Family history of malignant neoplasm of digestive organs: Secondary | ICD-10-CM | POA: Diagnosis not present

## 2017-05-17 DIAGNOSIS — G4700 Insomnia, unspecified: Secondary | ICD-10-CM

## 2017-05-17 DIAGNOSIS — Z808 Family history of malignant neoplasm of other organs or systems: Secondary | ICD-10-CM | POA: Diagnosis not present

## 2017-05-17 DIAGNOSIS — F418 Other specified anxiety disorders: Secondary | ICD-10-CM | POA: Insufficient documentation

## 2017-05-17 DIAGNOSIS — C50212 Malignant neoplasm of upper-inner quadrant of left female breast: Secondary | ICD-10-CM | POA: Diagnosis not present

## 2017-05-17 DIAGNOSIS — G4730 Sleep apnea, unspecified: Secondary | ICD-10-CM | POA: Diagnosis not present

## 2017-05-17 NOTE — Progress Notes (Signed)
CLINIC:  Survivorship   REASON FOR VISIT:  Routine follow-up post-treatment for a recent history of breast cancer.  BRIEF ONCOLOGIC HISTORY:    Malignant neoplasm of upper-inner quadrant of left breast in female, estrogen receptor positive ([place])   09/26/2016 Initial Diagnosis    Screening detected distortion in the left breast retroareolar region at 9:30 position 1.2 cm, axilla negative. Biopsy revealed grade 1 invasive ductal carcinoma with DCIS ER 95%, PR 40%, HER-2 negative ratio 0.84, Ki-67 3%, T1c N0 stage IA AJCC 8      10/16/2016 Surgery    Left lumpectomy: IDC grade 1, 1.2 cm, DCIS with necrosis, margins negative, 0/2 lymph nodes negative, ER 95%, PR 40%, HER-2 negative ratio 0.84, Ki-67 3%, T1 CN 0 stage IA      11/26/2016 - 01/11/2017 Radiation Therapy    Adjuvant radiation therapy      02/2017 -  Anti-estrogen oral therapy    Letrozole daily       INTERVAL HISTORY:  Grace Cunningham presents to the [facility] today for our initial meeting to review her survivorship care plan detailing her treatment course for breast cancer, as well as monitoring long-term side effects of that treatment, education regarding health maintenance, screening, and overall wellness and health promotion.     Overall, Grace Cunningham reports feeling quite well.  She is taking Letrozole daily.  She has some mild breast pain that is stable.  She has occasional hot flashes, and mild fatigue.  She says arthralgias are not bad.      REVIEW OF SYSTEMS:  Review of Systems  Constitutional: Negative for appetite change, chills, fatigue and fever.  HENT:   Negative for hearing loss, lump/mass and trouble swallowing.   Eyes: Negative for eye problems and icterus.  Respiratory: Negative for chest tightness, cough and shortness of breath.   Cardiovascular: Negative for chest pain, leg swelling and palpitations.  Gastrointestinal: Negative for abdominal distention, abdominal pain, diarrhea, nausea and  vomiting.  Endocrine: Negative for hot flashes.  Skin: Negative for itching and rash.  Neurological: Negative for dizziness, extremity weakness, headaches and numbness.  Hematological: Negative for adenopathy. Does not bruise/bleed easily.  Psychiatric/Behavioral: Negative for depression. The patient is not nervous/anxious.   Breast: Denies any new nodularity, masses, tenderness, nipple changes, or nipple discharge.      ONCOLOGY TREATMENT TEAM:  1. Surgeon:  Dr. Barry Dienes at [facility] 2. Medical Oncologist: Dr. Lindi Adie  3. Radiation Oncologist: Dr. Lisbeth Renshaw    PAST MEDICAL/SURGICAL HISTORY:  Past Medical History:  Diagnosis Date  . Anxiety   . Cancer ([place]) 09/2016   left breast cancer  . Depression   . Insomnia   . Sleep apnea    uses CPAP nightly   Past Surgical History:  Procedure Laterality Date  . APPENDECTOMY    . BREAST LUMPECTOMY WITH RADIOACTIVE SEED AND SENTINEL LYMPH NODE BIOPSY Left 10/15/2016   Procedure: LEFT BREAST LUMPECTOMY WITH RADIOACTIVE SEED AND SENTINEL LYMPH NODE BIOPSY;  Surgeon: [name], MD;  Location: [facility];  Service: General;  Laterality: Left;  Marland Kitchen MANDIBULECTOMY       ALLERGIES:  No Known Allergies   CURRENT MEDICATIONS:  Outpatient Encounter Medications as of 05/17/2017  Medication Sig  . ALPRAZolam (XANAX) 0.25 MG tablet Take 0.25 mg by mouth at bedtime as needed for anxiety.  Marland Kitchen letrozole (FEMARA) 2.5 MG tablet Take 1 tablet (2.5 mg total) by mouth daily.  . Melatonin 1 MG CAPS Take 1 capsule by  mouth 3 (three) times a week.  . Multiple Vitamin (MULTIVITAMIN) tablet Take 1 tablet by mouth as needed.  . sertraline (ZOLOFT) 50 MG tablet Take 50 mg by mouth daily.  . [DISCONTINUED] hyaluronate sodium (RADIAPLEXRX) GEL Apply 1 application 2 (two) times daily topically. Apply after rad tx to breast  and bedtime, nothing 4 hours prior to rad tx  . [DISCONTINUED] oxyCODONE (OXY IR/ROXICODONE) 5 MG immediate release  tablet Take 0.5-2 tablets (2.5-10 mg total) by mouth every 4 (four) hours as needed for moderate pain, severe pain or breakthrough pain. (Patient not taking: Reported on 11/13/2016)   No facility-administered encounter medications on file as of 05/17/2017.      ONCOLOGIC FAMILY HISTORY:  Family History  Problem Relation Age of Onset  . Lung cancer Mother   . Skin cancer Maternal Grandmother   . Stomach cancer Maternal Grandfather   . Lung cancer Maternal Uncle      GENETIC COUNSELING/TESTING: Not at this time  SOCIAL HISTORY:  Social History   Socioeconomic History  . Marital status: Married    Spouse name: Not on file  . Number of children: Not on file  . Years of education: Not on file  . Highest education level: Not on file  Occupational History  . Not on file  Social Needs  . Financial resource strain: Not on file  . Food insecurity:    Worry: Not on file    Inability: Not on file  . Transportation needs:    Medical: Not on file    Non-medical: Not on file  Tobacco Use  . Smoking status: Never Smoker  . Smokeless tobacco: Never Used  Substance and Sexual Activity  . Alcohol use: Yes    Comment: social  . Drug use: No  . Sexual activity: Not on file  Lifestyle  . Physical activity:    Days per week: Not on file    Minutes per session: Not on file  . Stress: Not on file  Relationships  . Social connections:    Talks on phone: Not on file    Gets together: Not on file    Attends religious service: Not on file    Active member of club or organization: Not on file    Attends meetings of clubs or organizations: Not on file    Relationship status: Not on file  . Intimate partner violence:    Fear of current or ex partner: Not on file    Emotionally abused: Not on file    Physically abused: Not on file    Forced sexual activity: Not on file  Other Topics Concern  . Not on file  Social History Narrative  . Not on file     PHYSICAL EXAMINATION:  Vital  Signs:   Vitals:   05/17/17 1004  BP: 112/76  Pulse: 67  Resp: 18  Temp: 98.6 F (37 C)  SpO2: 100%   Filed Weights   05/17/17 1004  Weight: 136 lb 14.4 oz (62.1 kg)   General: Well-nourished, well-appearing female in no acute distress.  She is unaccompanied today.   HEENT: Head is normocephalic.  Pupils equal and reactive to light. Conjunctivae clear without exudate.  Sclerae anicteric. Oral mucosa is pink, moist.  Oropharynx is pink without lesions or erythema.  Lymph: No cervical, supraclavicular, or infraclavicular lymphadenopathy noted on palpation.  Cardiovascular: Regular rate and rhythm.Marland Kitchen Respiratory: Clear to auscultation bilaterally. Chest expansion symmetric; breathing non-labored.  Breast: left breast s/p lumpectomy, no nodules,  masses, skin changes noted, mild amt of scar tissue present, right breast without nodules, masses, skin changes. GI: Abdomen soft and round; non-tender, non-distended. Bowel sounds normoactive.  GU: Deferred.  Neuro: No focal deficits. Steady gait.  Psych: Mood and affect normal and appropriate for situation.  Extremities: No edema. MSK: No focal spinal tenderness to palpation.  Full range of motion in bilateral upper extremities Skin: Warm and dry.  LABORATORY DATA:  None for this visit.  DIAGNOSTIC IMAGING:  None for this visit.      ASSESSMENT AND PLAN:  Ms.. Grace Cunningham is a pleasant 57 y.o. female with Stage IA left breast invasive ductal carcinoma, ER+/PR+/HER2-, diagnosed in 09/2016, treated with lumpectomy, adjuvant radiation therapy, and anti-estrogen therapy with Letrozole beginning in 02/2017.  She presents to the Survivorship Clinic for our initial meeting and routine follow-up post-completion of treatment for breast cancer.    1. Stage IA left breast cancer:  Ms. Falkner is continuing to recover from definitive treatment for breast cancer. She will follow-up with her medical oncologist, Dr. Lindi Adie in 3 months with history and physical  exam per surveillance protocol.  She will continue her anti-estrogen therapy with Letrozole. Thus far, she is tolerating the Letrozole well, with minimal side effects. Today, a comprehensive survivorship care plan and treatment summary was reviewed with the patient today detailing her breast cancer diagnosis, treatment course, potential late/long-term effects of treatment, appropriate follow-up care with recommendations for the future, and patient education resources.  A copy of this summary, along with a letter will be sent to the patient's primary care provider via mail/fax/In Basket message after today's visit.    2. Bone health:  Given Ms. Witz's age/history of breast cancer and her current treatment regimen including anti-estrogen therapy with Letrozole, she is at risk for bone demineralization.  She has not undergone bone density testing and I ordered this today.  In the meantime, she was encouraged to increase her consumption of foods rich in calcium, as well as increase her weight-bearing activities.  She was given education on specific activities to promote bone health.  3. Cancer screening:  Due to Ms. Holtmeyer's history and her age, she should receive screening for skin cancers, colon cancer, and gynecologic cancers.  The information and recommendations are listed on the patient's comprehensive care plan/treatment summary and were reviewed in detail with the patient.    4. Health maintenance and wellness promotion: Ms. Cavey was encouraged to consume 5-7 servings of fruits and vegetables per day. We reviewed the "Nutrition Rainbow" handout, as well as the handout "Take Control of Your Health and Reduce Your Cancer Risk" from the [employer].  She was also encouraged to engage in moderate to vigorous exercise for 30 minutes per day most days of the week. We discussed the [employer] fitness program, which is designed for cancer survivors to help them become more physically fit after cancer  treatments.  She was instructed to limit her alcohol consumption and continue to abstain from tobacco use.     5. Support services/counseling: It is not uncommon for this period of the patient's cancer care trajectory to be one of many emotions and stressors.  We discussed an opportunity for her to participate in the next session of [facility] ("Finding Your New Normal") support group series designed for patients after they have completed treatment.   Ms. Carbonell was encouraged to take advantage of our many other support services programs, support groups, and/or counseling in coping with her new life  as a cancer survivor after completing anti-cancer treatment.  She was offered support today through active listening and expressive supportive counseling.  She was given information regarding our available services and encouraged to contact me with any questions or for help enrolling in any of our support group/programs.    Dispo:   -Return to cancer center for f/u with Dr. Lindi Adie in 3 months  -Mammogram due in 09/2017 -Bone density in 09/2017 -Follow up with surgery in 9 months -She is welcome to return back to the Survivorship Clinic at any time; no additional follow-up needed at this time.  -Consider referral back to survivorship as a long-term survivor for continued surveillance  A total of (30) minutes of face-to-face time was spent with this patient with greater than 50% of that time in counseling and care-coordination.   Gardenia Phlegm, NP Survivorship Program [facility] [phone]   Note: PRIMARY CARE PROVIDER Aretta Nip, [place] [phone]

## 2017-05-17 NOTE — Telephone Encounter (Signed)
Gave patient AVs and calendar of upcoming august appointments.  °

## 2017-08-20 ENCOUNTER — Inpatient Hospital Stay: Payer: BC Managed Care – PPO | Attending: Hematology and Oncology | Admitting: Hematology and Oncology

## 2017-08-20 ENCOUNTER — Telehealth: Payer: Self-pay | Admitting: Hematology and Oncology

## 2017-08-20 DIAGNOSIS — Z923 Personal history of irradiation: Secondary | ICD-10-CM

## 2017-08-20 DIAGNOSIS — C50212 Malignant neoplasm of upper-inner quadrant of left female breast: Secondary | ICD-10-CM

## 2017-08-20 DIAGNOSIS — Z79811 Long term (current) use of aromatase inhibitors: Secondary | ICD-10-CM

## 2017-08-20 DIAGNOSIS — N951 Menopausal and female climacteric states: Secondary | ICD-10-CM

## 2017-08-20 DIAGNOSIS — Z170 Estrogen receptor positive status [ER+]: Secondary | ICD-10-CM | POA: Diagnosis not present

## 2017-08-20 DIAGNOSIS — R5383 Other fatigue: Secondary | ICD-10-CM | POA: Diagnosis not present

## 2017-08-20 NOTE — Telephone Encounter (Signed)
Gave patient avs and calendar.   °

## 2017-08-20 NOTE — Progress Notes (Signed)
Patient Care Team: Rankins, Bill Salinas, MD as PCP - General (Family Medicine) [name], MD as Consulting Physician (General Surgery) Nicholas Lose, MD as Consulting Physician (Hematology and Oncology) Kyung Rudd, MD as Consulting Physician (Radiation Oncology) Delice Bison Charlestine Massed, NP as Nurse Practitioner (Hematology and Oncology)  DIAGNOSIS:  Encounter Diagnosis  Name Primary?  . Malignant neoplasm of upper-inner quadrant of left breast in female, estrogen receptor positive ([place])     SUMMARY OF ONCOLOGIC HISTORY:   Malignant neoplasm of upper-inner quadrant of left breast in female, estrogen receptor positive ([place])   09/26/2016 Initial Diagnosis    Screening detected distortion in the left breast retroareolar region at 9:30 position 1.2 cm, axilla negative. Biopsy revealed grade 1 invasive ductal carcinoma with DCIS ER 95%, PR 40%, HER-2 negative ratio 0.84, Ki-67 3%, T1c N0 stage IA AJCC 8      10/16/2016 Surgery    Left lumpectomy: IDC grade 1, 1.2 cm, DCIS with necrosis, margins negative, 0/2 lymph nodes negative, ER 95%, PR 40%, HER-2 negative ratio 0.84, Ki-67 3%, T1 CN 0 stage IA      11/26/2016 - 01/11/2017 Radiation Therapy    Adjuvant radiation therapy      02/2017 -  Anti-estrogen oral therapy    Letrozole daily       CHIEF COMPLIANT: Follow-up on letrozole therapy complaining of hot flashes and fatigue  INTERVAL HISTORY: Grace Cunningham is a 57 year old with above-mentioned history of left breast cancer treated with lumpectomy radiation is currently on letrozole therapy.  She reports to me that she has had fatigue from letrozole as a major side effect.  She also had hot flashes.  She would like to see if she can take less medication.  REVIEW OF SYSTEMS:   Constitutional: Denies fevers, chills or abnormal weight loss Eyes: Denies blurriness of vision Ears, nose, mouth, throat, and face: Denies mucositis or sore throat Respiratory: Denies cough,  dyspnea or wheezes Cardiovascular: Denies palpitation, chest discomfort Gastrointestinal:  Denies nausea, heartburn or change in bowel habits Skin: Denies abnormal skin rashes Lymphatics: Denies new lymphadenopathy or easy bruising Neurological:Denies numbness, tingling or new weaknesses Behavioral/Psych: Mood is stable, no new changes  Extremities: No lower extremity edema Breast:  denies any pain or lumps or nodules in either breasts All other systems were reviewed with the patient and are negative.  I have reviewed the past medical history, past surgical history, social history and family history with the patient and they are unchanged from previous note.  ALLERGIES:  has No Known Allergies.  MEDICATIONS:  Current Outpatient Medications  Medication Sig Dispense Refill  . ALPRAZolam (XANAX) 0.25 MG tablet Take 0.25 mg by mouth at bedtime as needed for anxiety.    Marland Kitchen letrozole (FEMARA) 2.5 MG tablet Take 1 tablet (2.5 mg total) by mouth daily. 90 tablet 3  . Melatonin 1 MG CAPS Take 1 capsule by mouth 3 (three) times a week.    . Multiple Vitamin (MULTIVITAMIN) tablet Take 1 tablet by mouth as needed.    . sertraline (ZOLOFT) 50 MG tablet Take 50 mg by mouth daily.     No current facility-administered medications for this visit.     PHYSICAL EXAMINATION: ECOG PERFORMANCE STATUS: 1 - Symptomatic but completely ambulatory  Vitals:   08/20/17 1012  BP: 124/66  Pulse: 74  Resp: 17  Temp: 98.5 F (36.9 C)  SpO2: 99%   Filed Weights   08/20/17 1012  Weight: 133 lb 11.2 oz (60.6 kg)  GENERAL:alert, no distress and comfortable SKIN: skin color, texture, turgor are normal, no rashes or significant lesions EYES: normal, Conjunctiva are pink and non-injected, sclera clear OROPHARYNX:no exudate, no erythema and lips, buccal mucosa, and tongue normal  NECK: supple, thyroid normal size, non-tender, without nodularity LYMPH:  no palpable lymphadenopathy in the cervical, axillary  or inguinal LUNGS: clear to auscultation and percussion with normal breathing effort HEART: regular rate & rhythm and no murmurs and no lower extremity edema ABDOMEN:abdomen soft, non-tender and normal bowel sounds MUSCULOSKELETAL:no cyanosis of digits and no clubbing  NEURO: alert & oriented x 3 with fluent speech, no focal motor/sensory deficits EXTREMITIES: No lower extremity edema BREAST: No palpable masses or nodules in either right or left breasts. No palpable axillary supraclavicular or infraclavicular adenopathy no breast tenderness or nipple discharge. (exam performed in the presence of a chaperone)  LABORATORY DATA:  I have reviewed the data as listed CMP Latest Ref Rng & Units 10/03/2016  Glucose 70 - 140 mg/dl 77  BUN 7.0 - 26.0 mg/dL 13.6  Creatinine 0.6 - 1.1 mg/dL 0.7  Sodium 136 - 145 mEq/L 141  Potassium 3.5 - 5.1 mEq/L 4.0  CO2 22 - 29 mEq/L 28  Calcium 8.4 - 10.4 mg/dL 9.6  Total Protein 6.4 - 8.3 g/dL 7.6  Total Bilirubin 0.20 - 1.20 mg/dL 0.36  Alkaline Phos 40 - 150 U/L 94  AST 5 - 34 U/L 25  ALT 0 - 55 U/L 18    Lab Results  Component Value Date   WBC 6.3 10/03/2016   HGB 13.6 10/03/2016   HCT 42.4 10/03/2016   MCV 85.5 10/03/2016   PLT 220 10/03/2016   NEUTROABS 3.5 10/03/2016    ASSESSMENT & PLAN:  Malignant neoplasm of upper-inner quadrant of left breast in female, estrogen receptor positive ([place]) 10/02/2018Left lumpectomy: IDC grade 1, 1.2 cm, DCIS with necrosis, margins negative, 0/2 lymph nodes negative, ER 95%, PR 40%, HER-2 negative ratio 0.84, Ki-67 3%, T1 CN 0 stage IA; insufficient tumor to do Oncotype testing Adjuvant radiation 11/26/2016 -01/11/2017  Treatment plan: Adjuvant antiestrogen therapy with letrozole 2.5 mg daily Letrozole Toxicities: Severe fatigue and hot flashes. We discussed different options and decided that she could take half a tablet of letrozole daily.  Breast cancer surveillance: 1.  Breast exam 05/17/2017: Benign 2.  Mammogram will be scheduled for September. Return to clinic in 1 year for follow-up   No orders of the defined types were placed in this encounter.  The patient has a good understanding of the overall plan. she agrees with it. she will call with any problems that may develop before the next visit here.   Harriette Ohara, MD 08/20/17

## 2017-08-20 NOTE — Assessment & Plan Note (Signed)
10/02/2018Left lumpectomy: IDC grade 1, 1.2 cm, DCIS with necrosis, margins negative, 0/2 lymph nodes negative, ER 95%, PR 40%, HER-2 negative ratio 0.84, Ki-67 3%, T1 CN 0 stage IA; insufficient tumor to do Oncotype testing Adjuvant radiation 11/26/2016 -01/11/2017  Treatment plan: Adjuvant antiestrogen therapy with letrozole 2.5 mg daily Letrozole Toxicities:  Breast cancer surveillance: 1.  Breast exam 05/17/2017: Benign 2. Mammogram will be scheduled for September.

## 2017-09-12 ENCOUNTER — Ambulatory Visit
Admission: RE | Admit: 2017-09-12 | Discharge: 2017-09-12 | Disposition: A | Discharge status: Home / Self Care | Payer: BC Managed Care – PPO | Source: Ambulatory Visit | Attending: Adult Health | Admitting: Adult Health

## 2017-09-12 DIAGNOSIS — C50212 Malignant neoplasm of upper-inner quadrant of left female breast: Secondary | ICD-10-CM

## 2017-09-12 DIAGNOSIS — E2839 Other primary ovarian failure: Secondary | ICD-10-CM

## 2017-09-12 DIAGNOSIS — Z170 Estrogen receptor positive status [ER+]: Principal | ICD-10-CM

## 2017-09-12 HISTORY — DX: Personal history of irradiation: Z92.3

## 2017-09-13 ENCOUNTER — Telehealth: Payer: Self-pay

## 2017-09-13 NOTE — Telephone Encounter (Signed)
Spoke with patient to inform of normal bone density results.  Patient voiced understanding and knows to call center with any questions/concerns.

## 2017-09-13 NOTE — Telephone Encounter (Signed)
-----   Message from Gardenia Phlegm, NP sent at 09/13/2017  9:03 AM EDT ----- Please call her and tell her that it is normal ----- Message ----- From: Interface, Rad Results In Sent: 09/12/2017   1:16 PM EDT To: Gardenia Phlegm, NP

## 2018-01-27 ENCOUNTER — Other Ambulatory Visit: Payer: Self-pay | Admitting: Hematology and Oncology

## 2018-08-14 ENCOUNTER — Telehealth: Payer: Self-pay | Admitting: Hematology and Oncology

## 2018-08-14 NOTE — Telephone Encounter (Signed)
I talk with patient regarding video visit and she will call my chart support

## 2018-08-14 NOTE — Assessment & Plan Note (Signed)
positive ([place]) 10/02/2018Left lumpectomy: IDC grade 1, 1.2 cm, DCIS with necrosis, margins negative, 0/2 lymph nodes negative, ER 95%, PR 40%, HER-2 negative ratio 0.84, Ki-67 3%, T1 CN 0 stage IA;insufficient tumor to do Oncotype testing Adjuvant radiation 11/26/2016-01/11/2017  Treatment plan: Adjuvant antiestrogen therapywith letrozole 2.5 mg daily Letrozole Toxicities: Severe fatigue and hot flashes. We discussed different options and decided that she could take half a tablet of letrozole daily.  Breast cancer surveillance: 1.  Breast exam 05/17/2017: Benign 2. Mammogram 09/12/2017: Benign, breast density category C Return to clinic in 1 year for follow-up

## 2018-08-15 ENCOUNTER — Other Ambulatory Visit: Payer: Self-pay | Admitting: Hematology and Oncology

## 2018-08-15 ENCOUNTER — Other Ambulatory Visit: Payer: Self-pay | Admitting: Adult Health

## 2018-08-15 DIAGNOSIS — Z9889 Other specified postprocedural states: Secondary | ICD-10-CM

## 2018-08-15 DIAGNOSIS — Z853 Personal history of malignant neoplasm of breast: Secondary | ICD-10-CM

## 2018-08-18 ENCOUNTER — Other Ambulatory Visit (HOSPITAL_COMMUNITY)
Admission: RE | Admit: 2018-08-18 | Discharge: 2018-08-18 | Disposition: A | Discharge status: Home / Self Care | Payer: BC Managed Care – PPO | Source: Ambulatory Visit | Attending: Family Medicine | Admitting: Family Medicine

## 2018-08-18 ENCOUNTER — Other Ambulatory Visit: Payer: Self-pay | Admitting: Family Medicine

## 2018-08-18 DIAGNOSIS — Z124 Encounter for screening for malignant neoplasm of cervix: Secondary | ICD-10-CM | POA: Insufficient documentation

## 2018-08-20 NOTE — Progress Notes (Signed)
HEMATOLOGY-ONCOLOGY MYCHART VIDEO VISIT PROGRESS NOTE  I connected with Grace Cunningham on 08/21/2018 at 11:45 AM EDT by MyChart video conference and verified that I am speaking with the correct person using two identifiers.  I discussed the limitations, risks, security and privacy concerns of performing an evaluation and management service by MyChart and the availability of in person appointments.  I also discussed with the patient that there may be a patient responsible charge related to this service. The patient expressed understanding and agreed to proceed.   Patient's Location: Home Physician Location: Clinic  CHIEF COMPLIANT: Follow-up on letrozole therapy   INTERVAL HISTORY: Grace Cunningham is a 58 y.o. female with above-mentioned history of left breast cancer treated with lumpectomy, radiation, and who is currently on letrozole therapy with 1/2 a tablet daily. I last saw her a year ago. Mammogram on 09/12/17 showed no evidence of malignancy bilaterally. She presents over MyChart today for annual follow-up.  She continues to have hot flashes and fatigue.  But she is managing the symptoms unable to continue on with letrozole.  Oncology History  Malignant neoplasm of upper-inner quadrant of left breast in female, estrogen receptor positive ([place])  09/26/2016 Initial Diagnosis   Screening detected distortion in the left breast retroareolar region at 9:30 position 1.2 cm, axilla negative. Biopsy revealed grade 1 invasive ductal carcinoma with DCIS ER 95%, PR 40%, HER-2 negative ratio 0.84, Ki-67 3%, T1c N0 stage IA AJCC 8   10/16/2016 Surgery   Left lumpectomy: IDC grade 1, 1.2 cm, DCIS with necrosis, margins negative, 0/2 lymph nodes negative, ER 95%, PR 40%, HER-2 negative ratio 0.84, Ki-67 3%, T1 CN 0 stage IA   11/26/2016 - 01/11/2017 Radiation Therapy   Adjuvant radiation therapy   02/2017 -  Anti-estrogen oral therapy   Letrozole 2.5 mg daily changed to 1/2 tab daily from 08/20/17 due  to fatigue and Inc cholesterol     REVIEW OF SYSTEMS:   Constitutional: Denies fevers, chills or abnormal weight loss Eyes: Denies blurriness of vision Ears, nose, mouth, throat, and face: Denies mucositis or sore throat Respiratory: Denies cough, dyspnea or wheezes Cardiovascular: Denies palpitation, chest discomfort Gastrointestinal:  Denies nausea, heartburn or change in bowel habits Skin: Denies abnormal skin rashes Lymphatics: Denies new lymphadenopathy or easy bruising Neurological:Denies numbness, tingling or new weaknesses Behavioral/Psych: Mood is stable, no new changes  Extremities: No lower extremity edema Breast: denies any pain or lumps or nodules in either breasts All other systems were reviewed with the patient and are negative.  Observations/Objective:  There were no vitals filed for this visit. There is no height or weight on file to calculate BMI.  I have reviewed the data as listed CMP Latest Ref Rng & Units 10/03/2016  Glucose 70 - 140 mg/dl 77  BUN 7.0 - 26.0 mg/dL 13.6  Creatinine 0.6 - 1.1 mg/dL 0.7  Sodium 136 - 145 mEq/L 141  Potassium 3.5 - 5.1 mEq/L 4.0  CO2 22 - 29 mEq/L 28  Calcium 8.4 - 10.4 mg/dL 9.6  Total Protein 6.4 - 8.3 g/dL 7.6  Total Bilirubin 0.20 - 1.20 mg/dL 0.36  Alkaline Phos 40 - 150 U/L 94  AST 5 - 34 U/L 25  ALT 0 - 55 U/L 18    Lab Results  Component Value Date   WBC 6.3 10/03/2016   HGB 13.6 10/03/2016   HCT 42.4 10/03/2016   MCV 85.5 10/03/2016   PLT 220 10/03/2016   NEUTROABS 3.5 10/03/2016  Assessment Plan:  Malignant neoplasm of upper-inner quadrant of left breast in female, estrogen receptor positive ([name]) positive ([place]) 10/02/2018Left lumpectomy: IDC grade 1, 1.2 cm, DCIS with necrosis, margins negative, 0/2 lymph nodes negative, ER 95%, PR 40%, HER-2 negative ratio 0.84, Ki-67 3%, T1 CN 0 stage IA;insufficient tumor to do Oncotype testing Adjuvant radiation 11/26/2016-01/11/2017  Treatment plan:  Adjuvant antiestrogen therapywith letrozole 2.5 mg daily Letrozole Toxicities: Severe fatigue and hot flashes. We discussed different options and decided that she could take half a tablet of letrozole daily.  High cholesterol: took statins for 6 weeks. Improved. Now on intermittent fasting Breast cancer surveillance: 1.  Breast exam 05/17/2017: Benign 2. Mammogram 09/12/2017: Benign, breast density category C Return to clinic in 1 year for follow-up  I discussed the assessment and treatment plan with the patient. The patient was provided an opportunity to ask questions and all were answered. The patient agreed with the plan and demonstrated an understanding of the instructions. The patient was advised to call back or seek an in-person evaluation if the symptoms worsen or if the condition fails to improve as anticipated.   I provided 15 minutes of face-to-face MyChart video visit time during this encounter.    Rulon Eisenmenger, MD 08/21/2018   I, Molly Dorshimer, am acting as scribe for Nicholas Lose, MD.  I have reviewed the above documentation for accuracy and completeness, and I agree with the above.

## 2018-08-21 ENCOUNTER — Telehealth: Payer: Self-pay | Admitting: Hematology and Oncology

## 2018-08-21 ENCOUNTER — Inpatient Hospital Stay: Payer: BC Managed Care – PPO | Attending: Hematology and Oncology | Admitting: Hematology and Oncology

## 2018-08-21 DIAGNOSIS — Z170 Estrogen receptor positive status [ER+]: Secondary | ICD-10-CM | POA: Insufficient documentation

## 2018-08-21 DIAGNOSIS — C50212 Malignant neoplasm of upper-inner quadrant of left female breast: Secondary | ICD-10-CM | POA: Diagnosis not present

## 2018-08-21 DIAGNOSIS — Z79811 Long term (current) use of aromatase inhibitors: Secondary | ICD-10-CM | POA: Insufficient documentation

## 2018-08-21 LAB — CYTOLOGY - PAP
Diagnosis: NEGATIVE
HPV: NOT DETECTED

## 2018-08-21 MED ORDER — LETROZOLE 2.5 MG PO TABS: 2.5000 mg | ORAL_TABLET | Freq: Every day | ORAL | 3 refills | Status: DC

## 2018-08-21 MED ORDER — TRAZODONE HCL 50 MG PO TABS
50.0000 mg | ORAL_TABLET | Freq: Every day | ORAL | Status: AC
Start: 2018-08-21 — End: ?

## 2018-08-21 NOTE — Telephone Encounter (Signed)
I talk with patient  Regarding schedule

## 2018-08-25 ENCOUNTER — Encounter: Payer: Self-pay | Admitting: Hematology and Oncology

## 2018-08-26 ENCOUNTER — Telehealth: Payer: Self-pay | Admitting: Hematology and Oncology

## 2018-08-26 ENCOUNTER — Other Ambulatory Visit: Payer: Self-pay

## 2018-08-26 ENCOUNTER — Other Ambulatory Visit: Payer: Self-pay | Admitting: Hematology and Oncology

## 2018-08-26 ENCOUNTER — Inpatient Hospital Stay: Payer: BC Managed Care – PPO

## 2018-08-26 DIAGNOSIS — Z79811 Long term (current) use of aromatase inhibitors: Secondary | ICD-10-CM | POA: Diagnosis not present

## 2018-08-26 DIAGNOSIS — D709 Neutropenia, unspecified: Secondary | ICD-10-CM

## 2018-08-26 DIAGNOSIS — C50212 Malignant neoplasm of upper-inner quadrant of left female breast: Secondary | ICD-10-CM | POA: Diagnosis not present

## 2018-08-26 DIAGNOSIS — Z170 Estrogen receptor positive status [ER+]: Secondary | ICD-10-CM | POA: Diagnosis not present

## 2018-08-26 LAB — CBC WITH DIFFERENTIAL (CANCER CENTER ONLY)
Abs Immature Granulocytes: 0.01 10*3/uL (ref 0.00–0.07)
Basophils Absolute: 0 10*3/uL (ref 0.0–0.1)
Basophils Relative: 1 %
Eosinophils Absolute: 0.2 10*3/uL (ref 0.0–0.5)
Eosinophils Relative: 3 %
HCT: 42 % (ref 36.0–46.0)
Hemoglobin: 13.1 g/dL (ref 12.0–15.0)
Immature Granulocytes: 0 %
Lymphocytes Relative: 24 %
Lymphs Abs: 1.4 10*3/uL (ref 0.7–4.0)
MCH: 27.1 pg (ref 26.0–34.0)
MCHC: 31.2 g/dL (ref 30.0–36.0)
MCV: 86.8 fL (ref 80.0–100.0)
Monocytes Absolute: 0.4 10*3/uL (ref 0.1–1.0)
Monocytes Relative: 7 %
Neutro Abs: 3.6 10*3/uL (ref 1.7–7.7)
Neutrophils Relative %: 65 %
Platelet Count: 206 10*3/uL (ref 150–400)
RBC: 4.84 MIL/uL (ref 3.87–5.11)
RDW: 13 % (ref 11.5–15.5)
WBC Count: 5.6 10*3/uL (ref 4.0–10.5)
nRBC: 0 % (ref 0.0–0.2)

## 2018-08-26 LAB — CMP (CANCER CENTER ONLY)
ALT: 41 U/L (ref 0–44)
AST: 34 U/L (ref 15–41)
Albumin: 4.2 g/dL (ref 3.5–5.0)
Alkaline Phosphatase: 108 U/L (ref 38–126)
Anion gap: 10 (ref 5–15)
BUN: 19 mg/dL (ref 6–20)
CO2: 27 mmol/L (ref 22–32)
Calcium: 9.3 mg/dL (ref 8.9–10.3)
Chloride: 103 mmol/L (ref 98–111)
Creatinine: 0.8 mg/dL (ref 0.44–1.00)
GFR, Est AFR Am: >60 mL/min (ref >60)
GFR, Estimated: >60 mL/min (ref >60)
Glucose, Bld: 82 mg/dL (ref 70–99)
Potassium: 4.2 mmol/L (ref 3.5–5.1)
Sodium: 140 mmol/L (ref 135–145)
Total Bilirubin: 0.2 mg/dL — ABNORMAL LOW (ref 0.3–1.2)
Total Protein: 7.2 g/dL (ref 6.5–8.1)

## 2018-08-26 LAB — RETICULOCYTES
Immature Retic Fract: 7.9 % (ref 2.3–15.9)
RBC.: 4.88 MIL/uL (ref 3.87–5.11)
Retic Count, Absolute: 49.8 10*3/uL (ref 19.0–186.0)
Retic Ct Pct: 1 % (ref 0.4–3.1)

## 2018-08-26 LAB — FOLATE: Folate: 30.6 ng/mL (ref >5.9)

## 2018-08-26 LAB — VITAMIN B12: Vitamin B-12: 678 pg/mL (ref 180–914)

## 2018-08-26 NOTE — Telephone Encounter (Signed)
I discussed with Dr. Harrington Challenger about her labs done by her primary care physician.  She was found to have leukopenia. We will perform the complete differential and blood smear evaluation along with checking her CMP reticulocytes ANA and flow cytometry I will call her this afternoon with the results of the CBC. Further appointments will be made based on these results.  She may require a bone marrow biopsy if he cannot identify it caused by the initial blood work.

## 2018-08-26 NOTE — Telephone Encounter (Signed)
I informed patient voicemail that the blood work came back normal. There is absolutely no concern for the CBC CMP or reticulocytes. Jak 2 mutation testing is still pending.

## 2018-08-27 LAB — ANTINUCLEAR ANTIBODIES, IFA: ANA Ab, IFA: NEGATIVE

## 2018-08-29 LAB — FLOW CYTOMETRY

## 2018-09-15 ENCOUNTER — Other Ambulatory Visit: Payer: Self-pay

## 2018-09-15 ENCOUNTER — Ambulatory Visit
Admission: RE | Admit: 2018-09-15 | Discharge: 2018-09-15 | Disposition: A | Discharge status: Home / Self Care | Payer: BC Managed Care – PPO | Source: Ambulatory Visit | Attending: Adult Health | Admitting: Adult Health

## 2018-09-15 DIAGNOSIS — Z853 Personal history of malignant neoplasm of breast: Secondary | ICD-10-CM

## 2018-09-15 DIAGNOSIS — Z9889 Other specified postprocedural states: Secondary | ICD-10-CM

## 2018-11-02 IMAGING — MG 2D DIGITAL DIAGNOSTIC UNILATERAL LEFT MAMMOGRAM WITH CAD AND ADJ
6 series · 6 of 14 positions shown · non-contrast
Comparison: Previous exam(s).

CLINICAL DATA: The patient was called back from screening
mammography due to left breast distortion.

EXAM:
2D DIGITAL DIAGNOSTIC LEFT MAMMOGRAM WITH ADJUNCT TOMO
ULTRASOUND LEFT BREAST

[L MLO synth-2D]
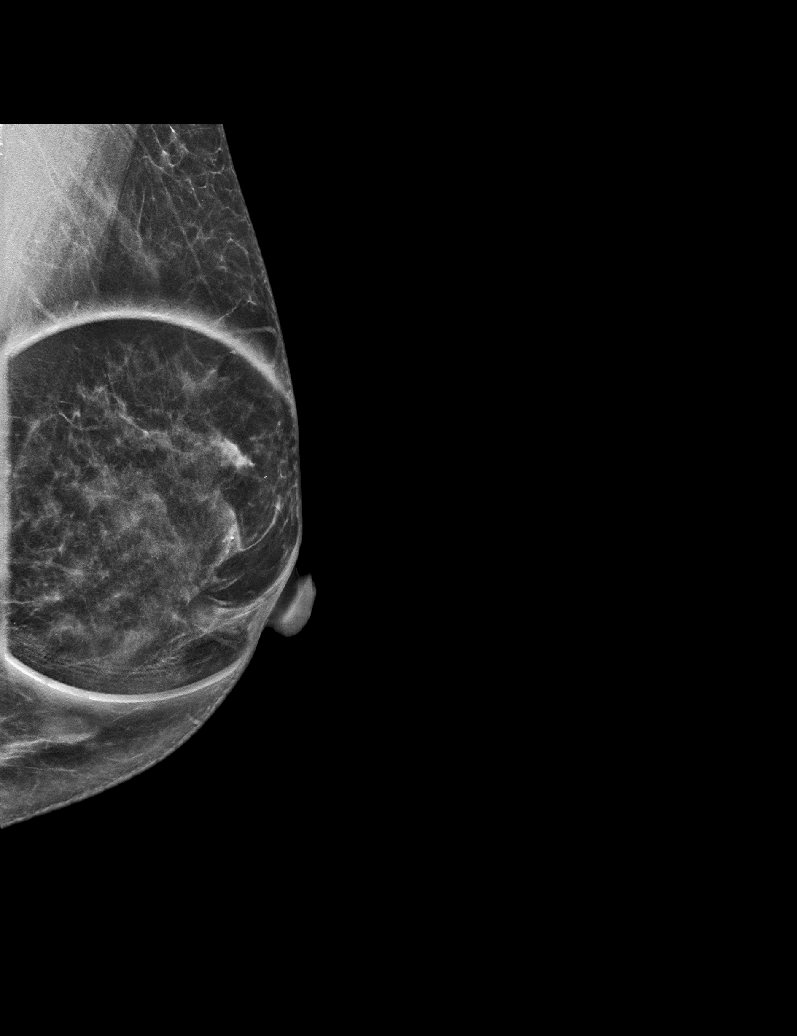

[L CC]
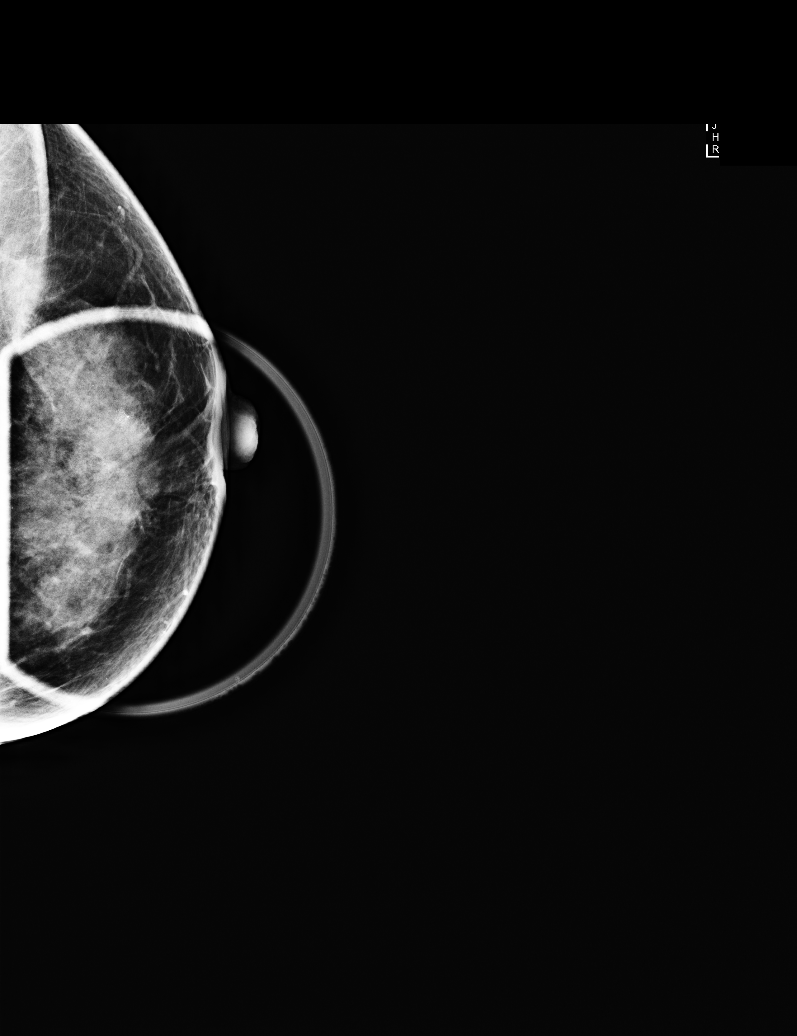

[L CC synth-2D]
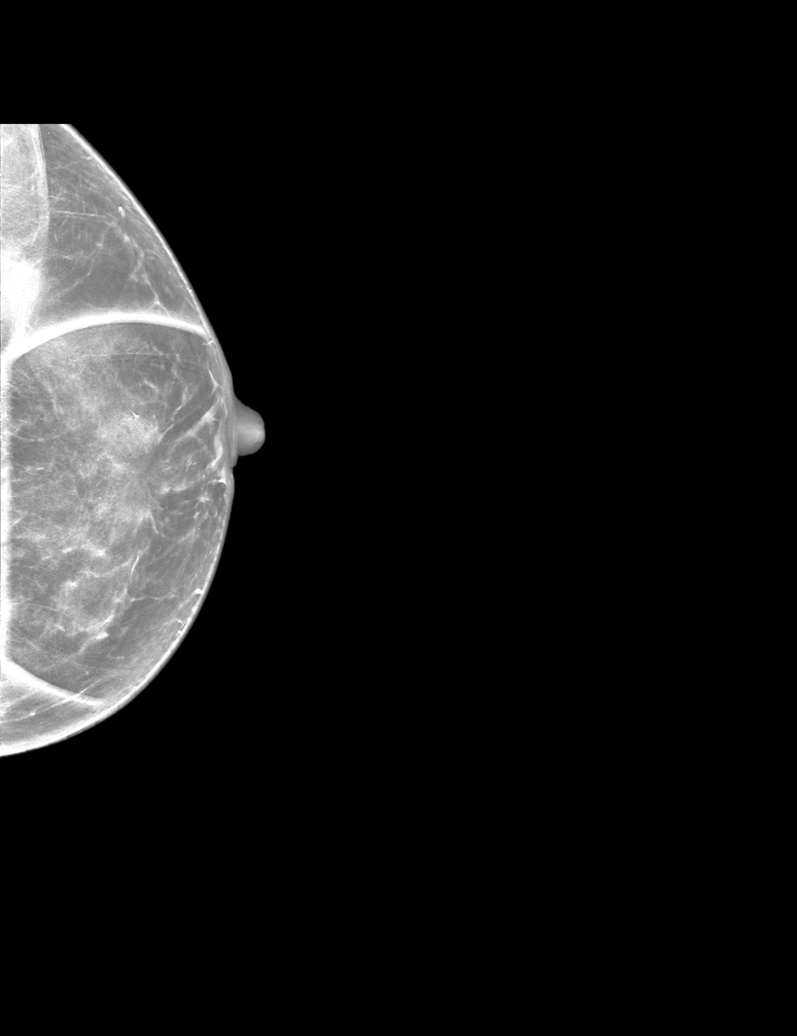

[L MLO]
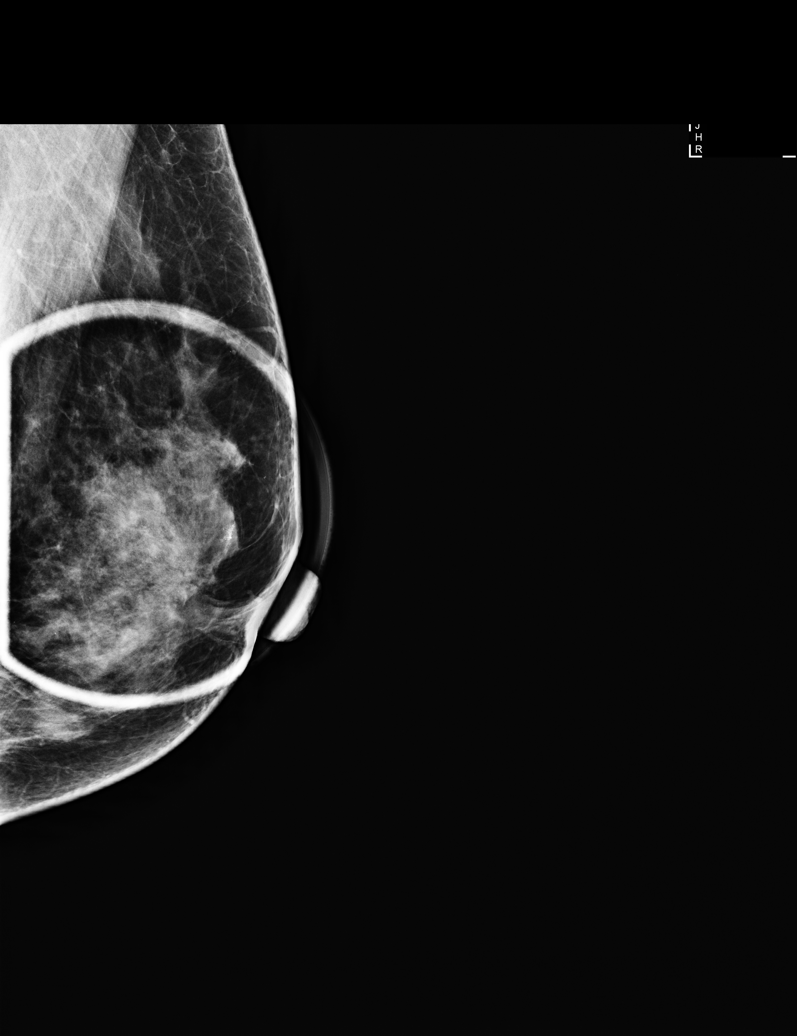

[L CC tomo · tomo slice 28/55.0]
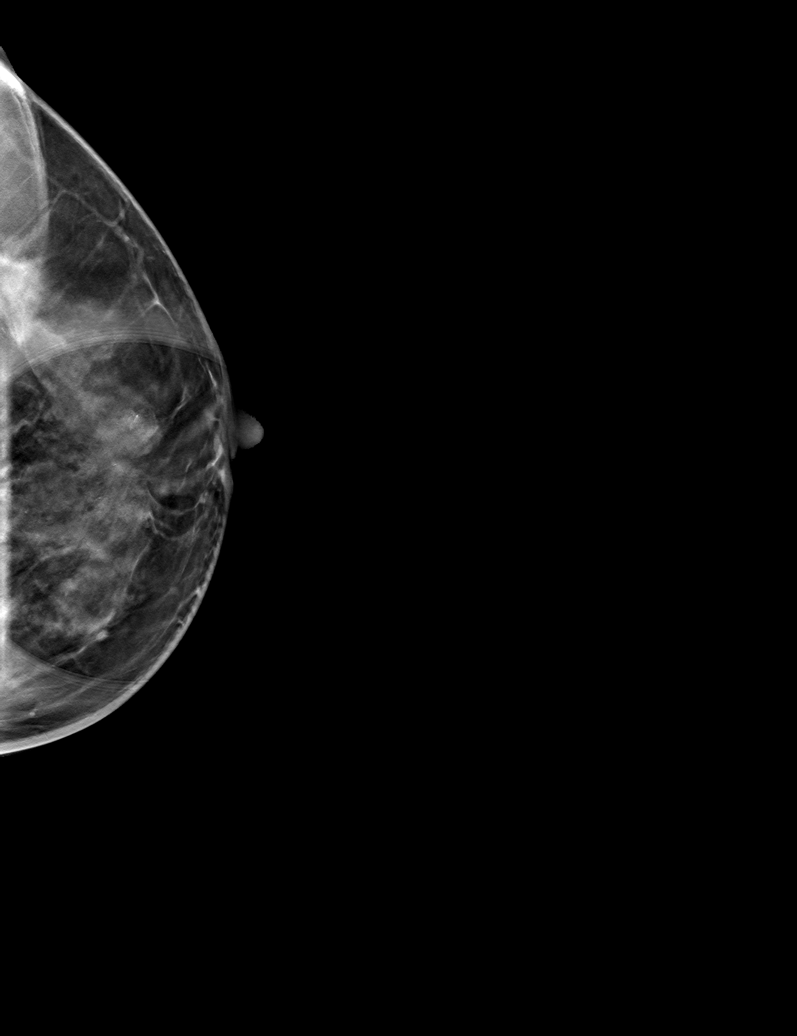

[L MLO tomo · tomo slice 25/50.0]
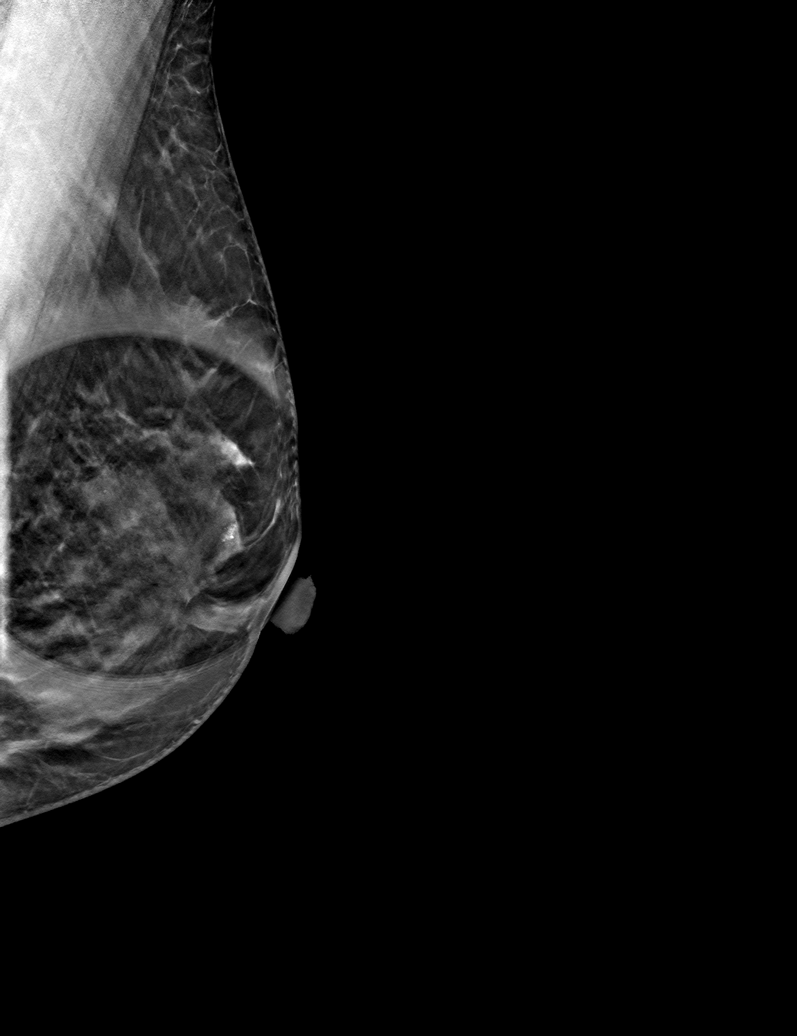

[6 of 14 positions shown; findings below may reference images not displayed]

ACR Breast Density Category c: The breast tissue is heterogeneously
dense, which may obscure small masses.
FINDINGS: There is distortion in the left retroareolar region which persists
on today's imaging. Calcifications in the left breast are stable for
many years.

On physical exam, no suspicious lumps are identified.

Targeted ultrasound is performed, showing a suspicious irregular
mass at [DATE] in the retroareolar region measuring 6 x 12 x 10 mm. No
axillary adenopathy.
IMPRESSION: Suspicious mass in the left breast.

RECOMMENDATION:
Recommend ultrasound-guided biopsy of the suspicious left breast
mass. If the mass represents malignancy, consider breast MRI to
assess extent of disease.

I have discussed the findings and recommendations with the patient.
Results were also provided in writing at the conclusion of the
visit. If applicable, a reminder letter will be sent to the patient
regarding the next appointment.

BI-RADS CATEGORY  5: Highly suggestive of malignancy.

## 2018-11-02 IMAGING — US ULTRASOUND LEFT BREAST LIMITED
1 series · 11 of 11 positions shown · non-contrast
Comparison: Previous exam(s).

CLINICAL DATA: The patient was called back from screening
mammography due to left breast distortion.

EXAM:
2D DIGITAL DIAGNOSTIC LEFT MAMMOGRAM WITH ADJUNCT TOMO
ULTRASOUND LEFT BREAST

[Series 1: ultrasound left breast limited · 0.06mm/px · 11 of 11 slices shown]
[im 1/11]
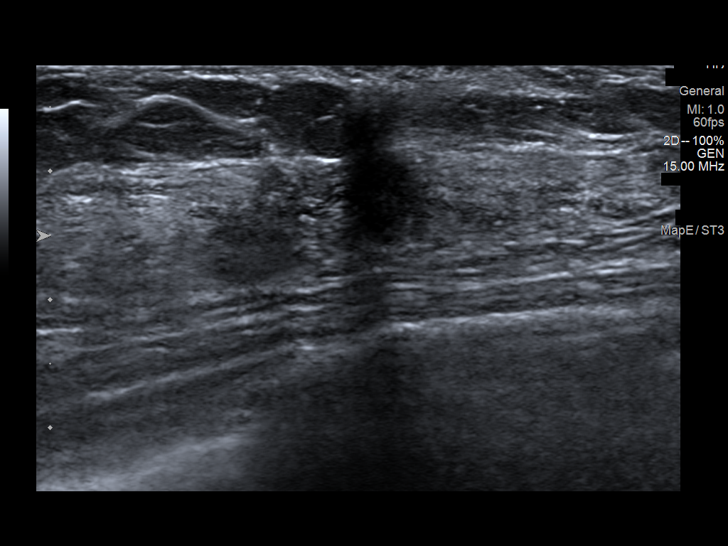
[im 2/11]
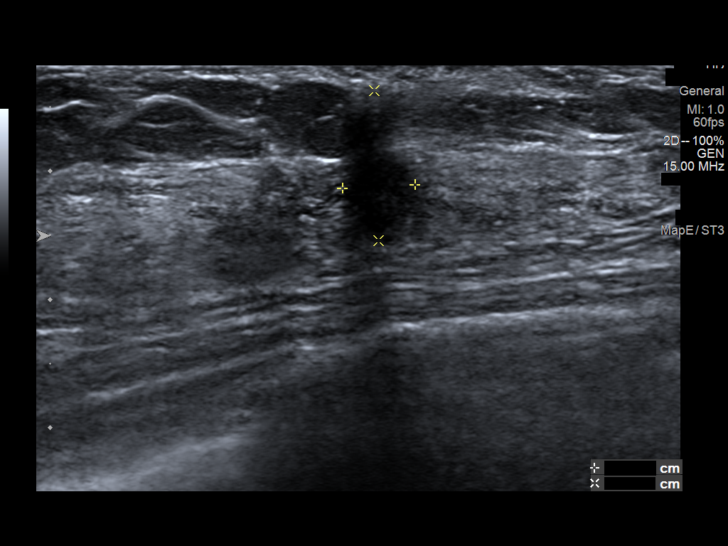
[im 3/11]
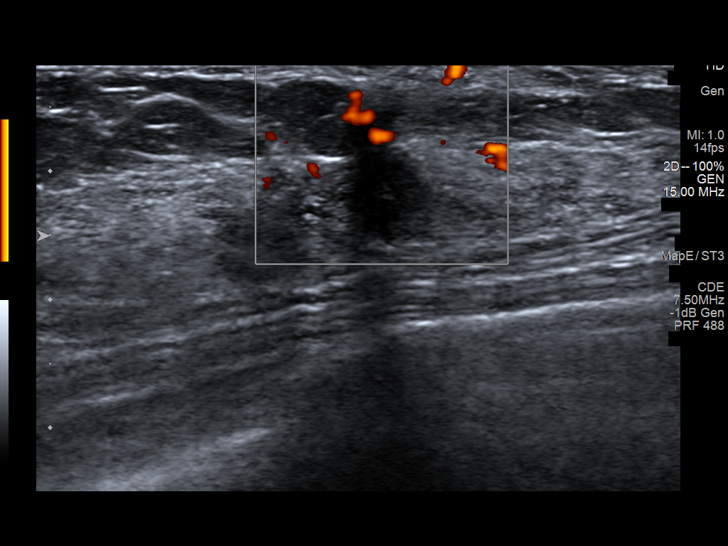
[im 4/11]
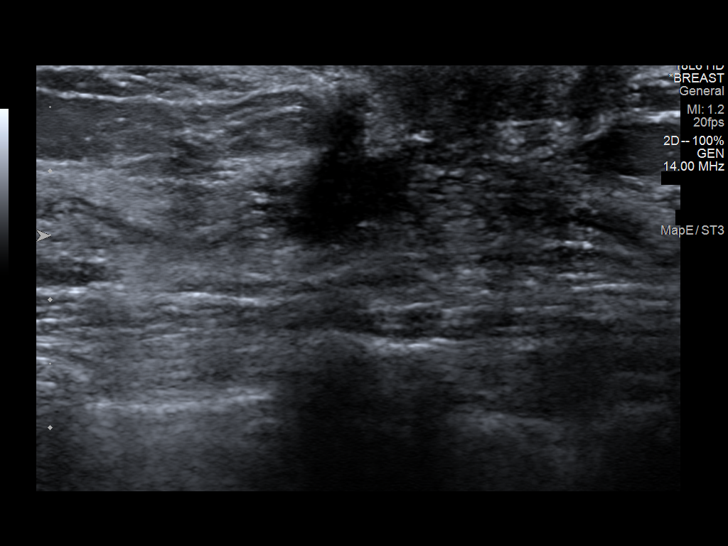
[im 5/11]
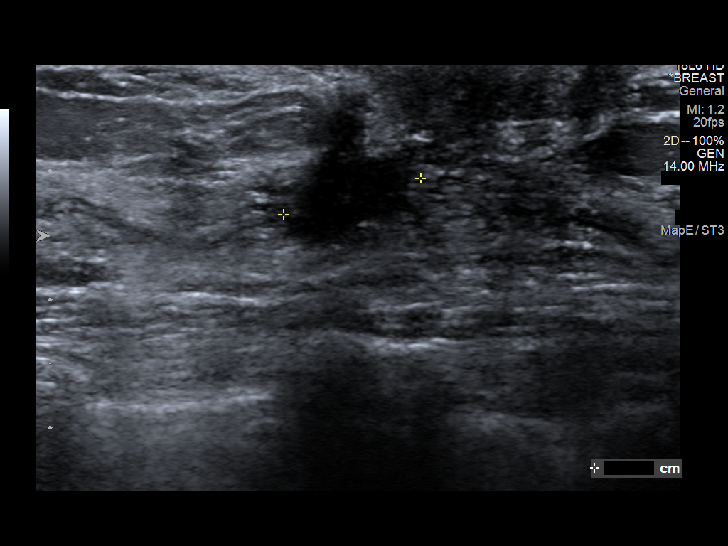
[im 6/11]
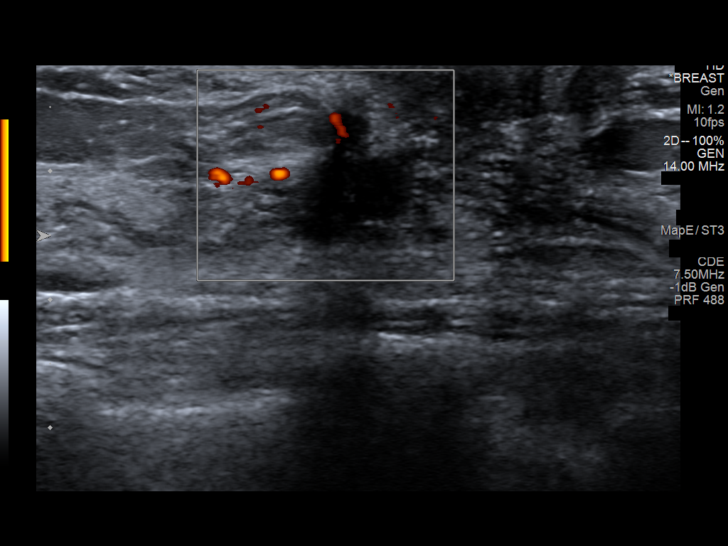
[im 7/11]
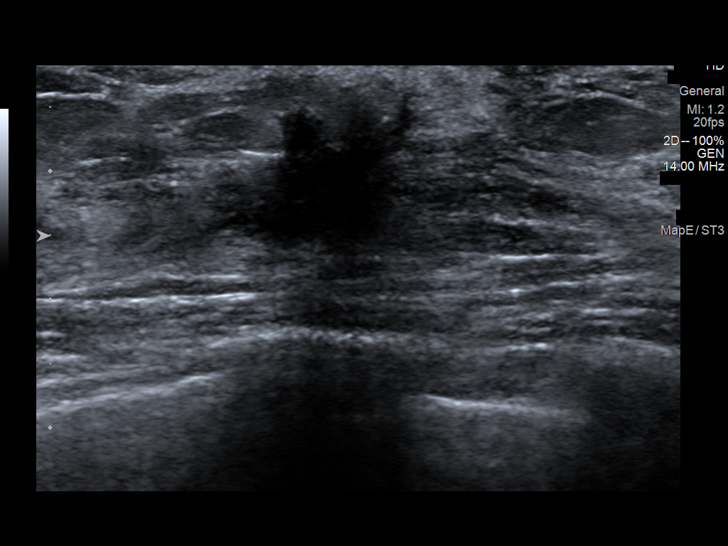
[im 8/11]
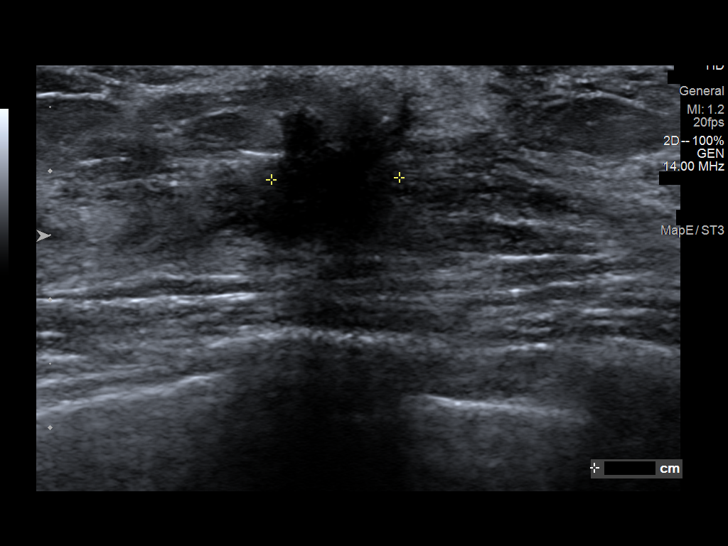
[im 9/11]
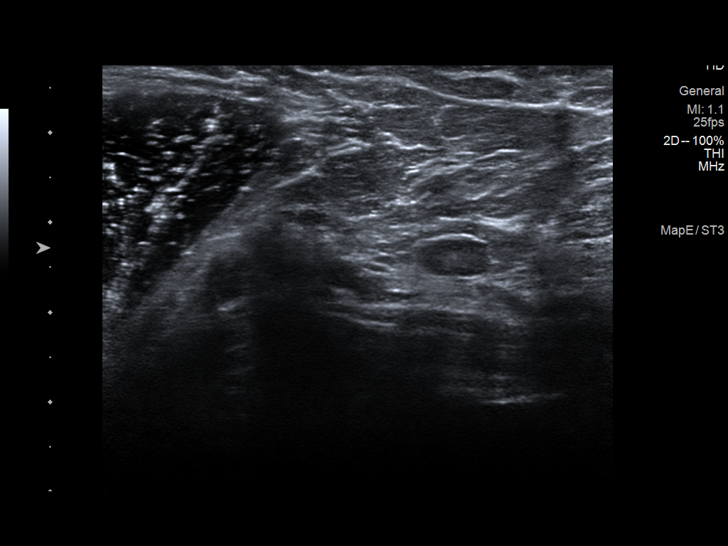
[im 10/11]
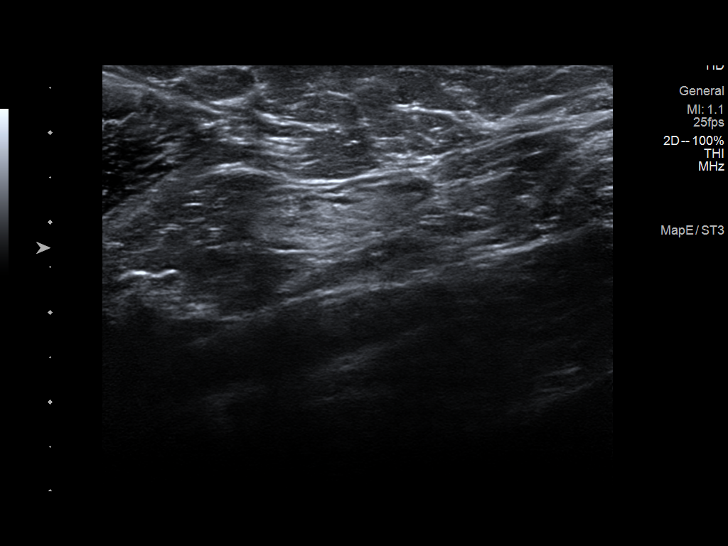
[im 11/11]
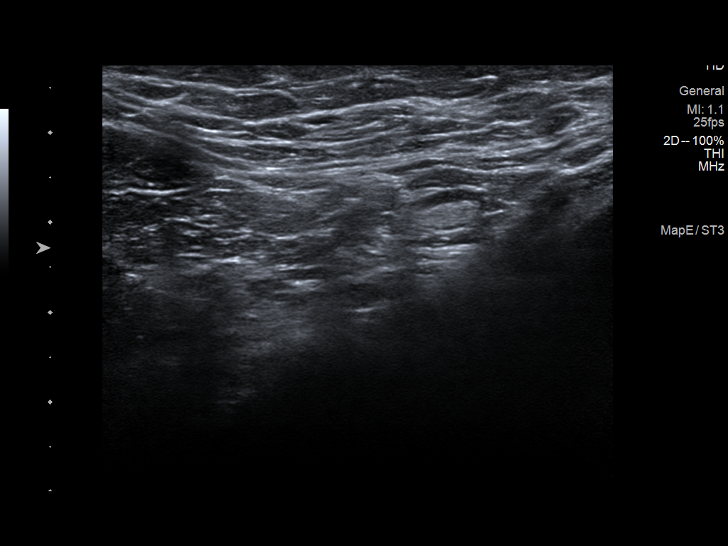

[11 of 11 positions shown; findings below may reference images not displayed]

ACR Breast Density Category c: The breast tissue is heterogeneously
dense, which may obscure small masses.
FINDINGS: There is distortion in the left retroareolar region which persists
on today's imaging. Calcifications in the left breast are stable for
many years.

On physical exam, no suspicious lumps are identified.

Targeted ultrasound is performed, showing a suspicious irregular
mass at [DATE] in the retroareolar region measuring 6 x 12 x 10 mm. No
axillary adenopathy.
IMPRESSION: Suspicious mass in the left breast.

RECOMMENDATION:
Recommend ultrasound-guided biopsy of the suspicious left breast
mass. If the mass represents malignancy, consider breast MRI to
assess extent of disease.

I have discussed the findings and recommendations with the patient.
Results were also provided in writing at the conclusion of the
visit. If applicable, a reminder letter will be sent to the patient
regarding the next appointment.

BI-RADS CATEGORY  5: Highly suggestive of malignancy.

## 2018-11-04 IMAGING — US US BREAST BX W LOC DEV 1ST LESION IMG BX SPEC US GUIDE*L*
1 series · 8 of 8 positions shown · non-contrast
Comparison: Previous exam(s).

ADDENDUM:
Pathology revealed GRADE I INVASIVE DUCTAL CARCINOMA, DUCTAL
CARCINOMA IN SITU of the Left breast, 9:30 o'clock subareolar. This
was found to be concordant by Dr. Estivenson Salsedo.

Pathology results were discussed with the patient by telephone. The
patient reported doing well after the biopsy with tenderness at the
site. Post biopsy instructions and care were reviewed and questions
were answered. The patient was encouraged to call The [REDACTED]
The patient was referred to [REDACTED]
[REDACTED] at [REDACTED] on
October 03, 2016. Recommendation for bilateral breast MRI due to
breast density and to evaluate extent of disease.
Pathology results reported by Goga Giorgi Handmades, RN on 09/27/2016.
CLINICAL DATA: 56-year-old female with suspicious left breast mass.
EXAM:
ULTRASOUND GUIDED LEFT BREAST CORE NEEDLE BIOPSY

[Series 1: us breast bx w loc dev 1st lesion img bx spec us g · 0.07mm/px · 8 of 8 slices shown]
[im 1/8]
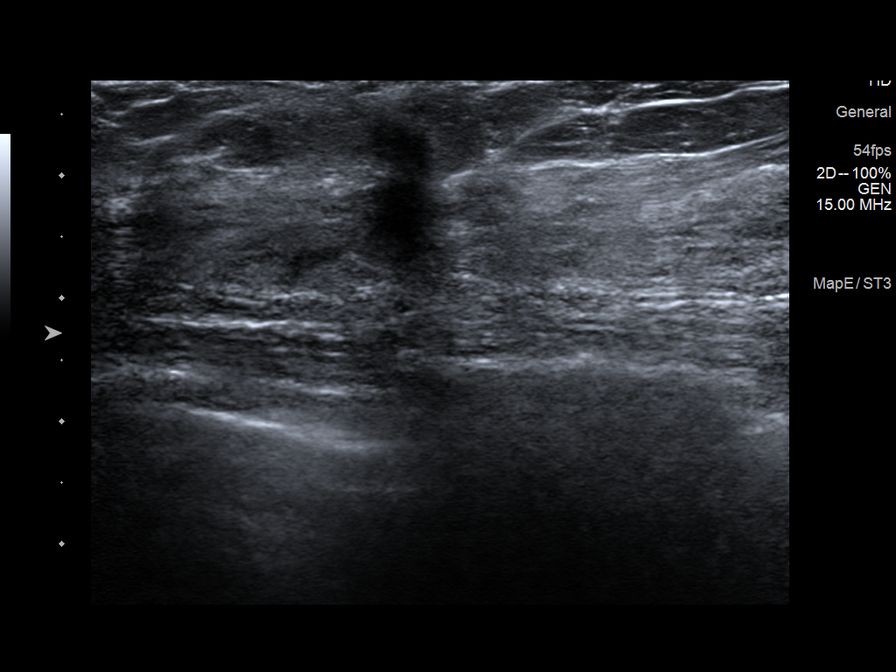
[im 2/8]
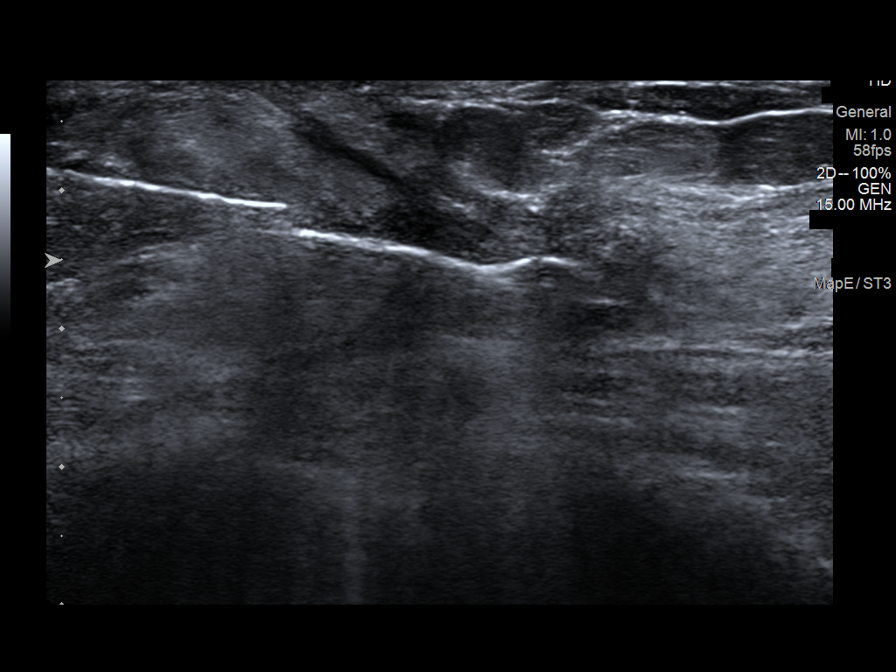
[im 3/8]
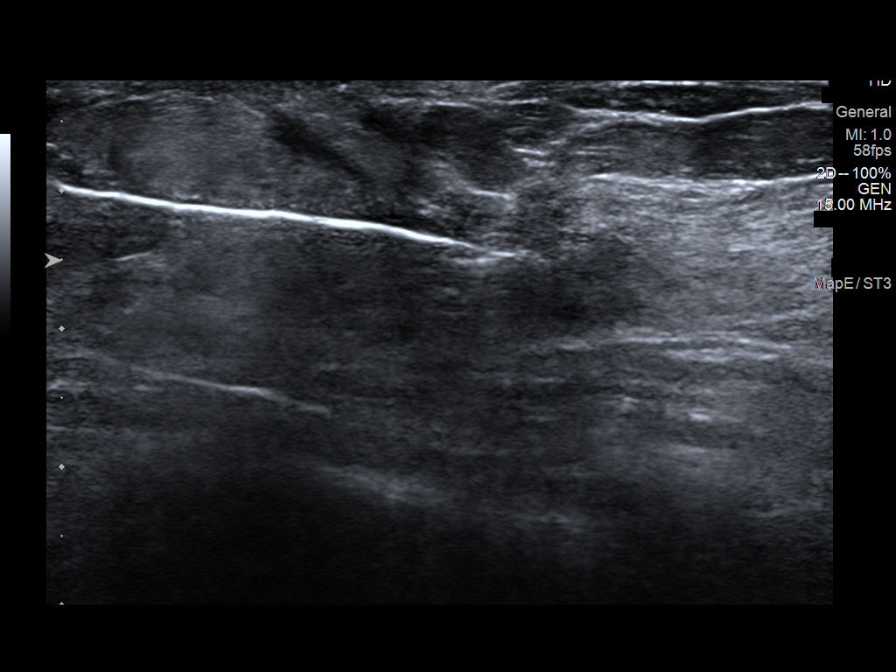
[im 4/8]
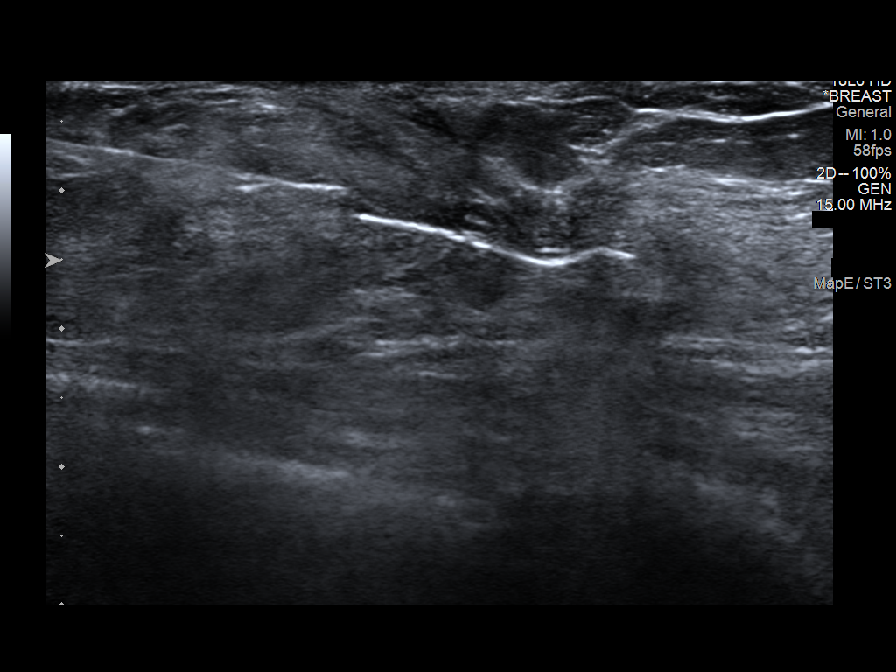
[im 5/8]
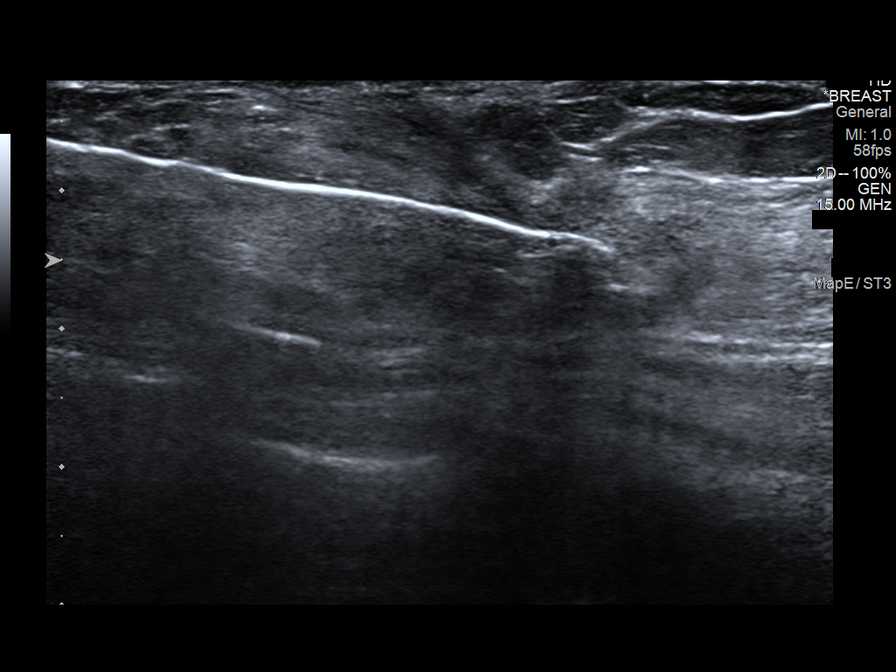
[im 6/8]
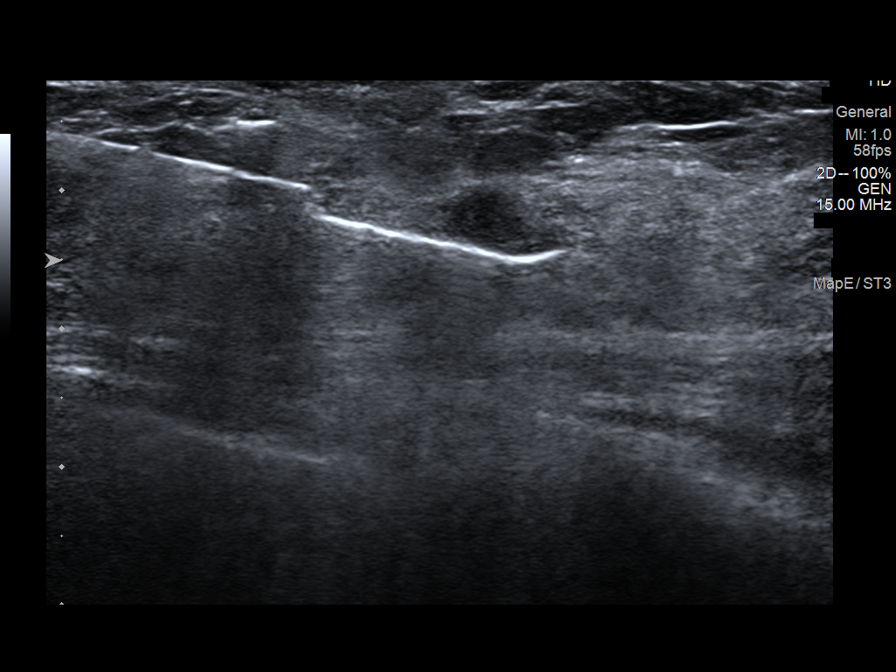
[im 7/8]
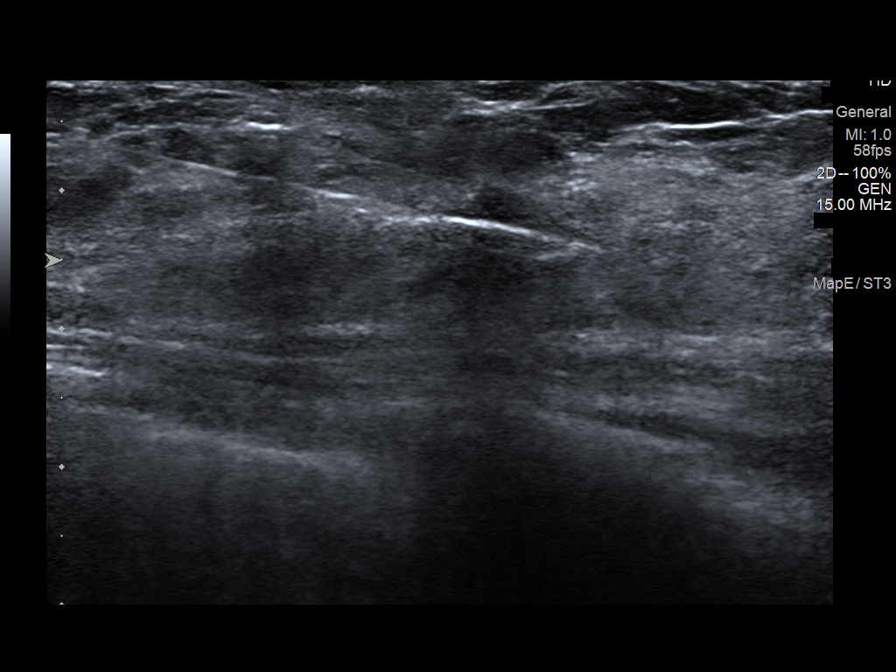
[im 8/8]
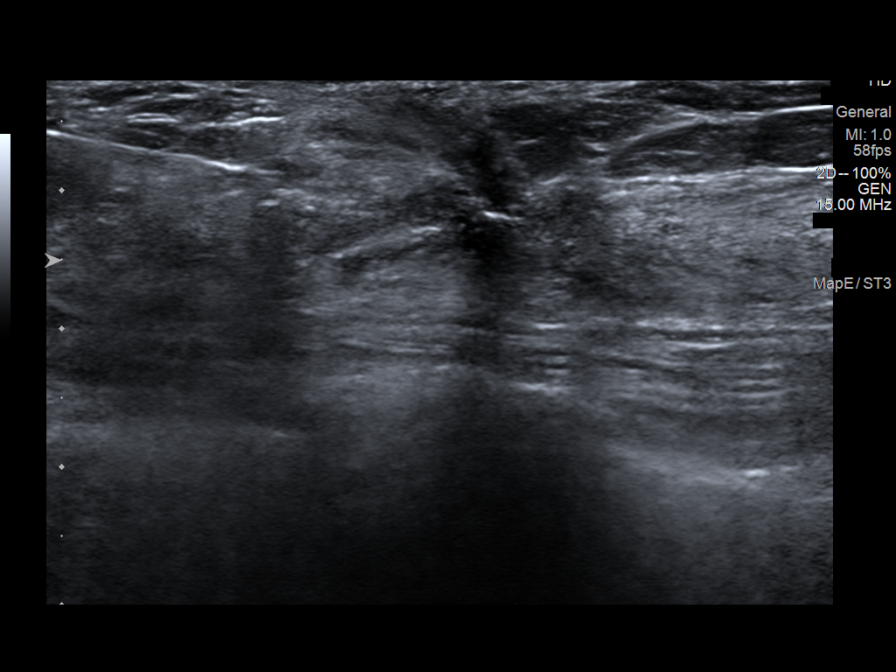

[8 of 8 positions shown; findings below may reference images not displayed]



Lesion quadrant: Upper-outer quadrant.

Using sterile technique and 1% Lidocaine as local anesthetic, under
direct ultrasound visualization, a 14 gauge Joji device was
used to perform biopsy of a left breast mass using a inferior
approach. At the conclusion of the procedure a ribbon shaped tissue
marker clip was deployed into the biopsy cavity. Follow up 2 view
mammogram was performed and dictated separately.
IMPRESSION: Ultrasound guided biopsy of a left breast mass. No apparent
complications.

## 2018-11-23 IMAGING — MG BREAST SURGICAL SPECIMEN
1 series · 1 of 1 positions shown · non-contrast
Comparison: Previous exam(s).

CLINICAL DATA: Evaluate surgical specimen following left breast
lumpectomy for left breast cancer.

EXAM:
SPECIMEN RADIOGRAPH OF THE LEFT BREAST

[L]
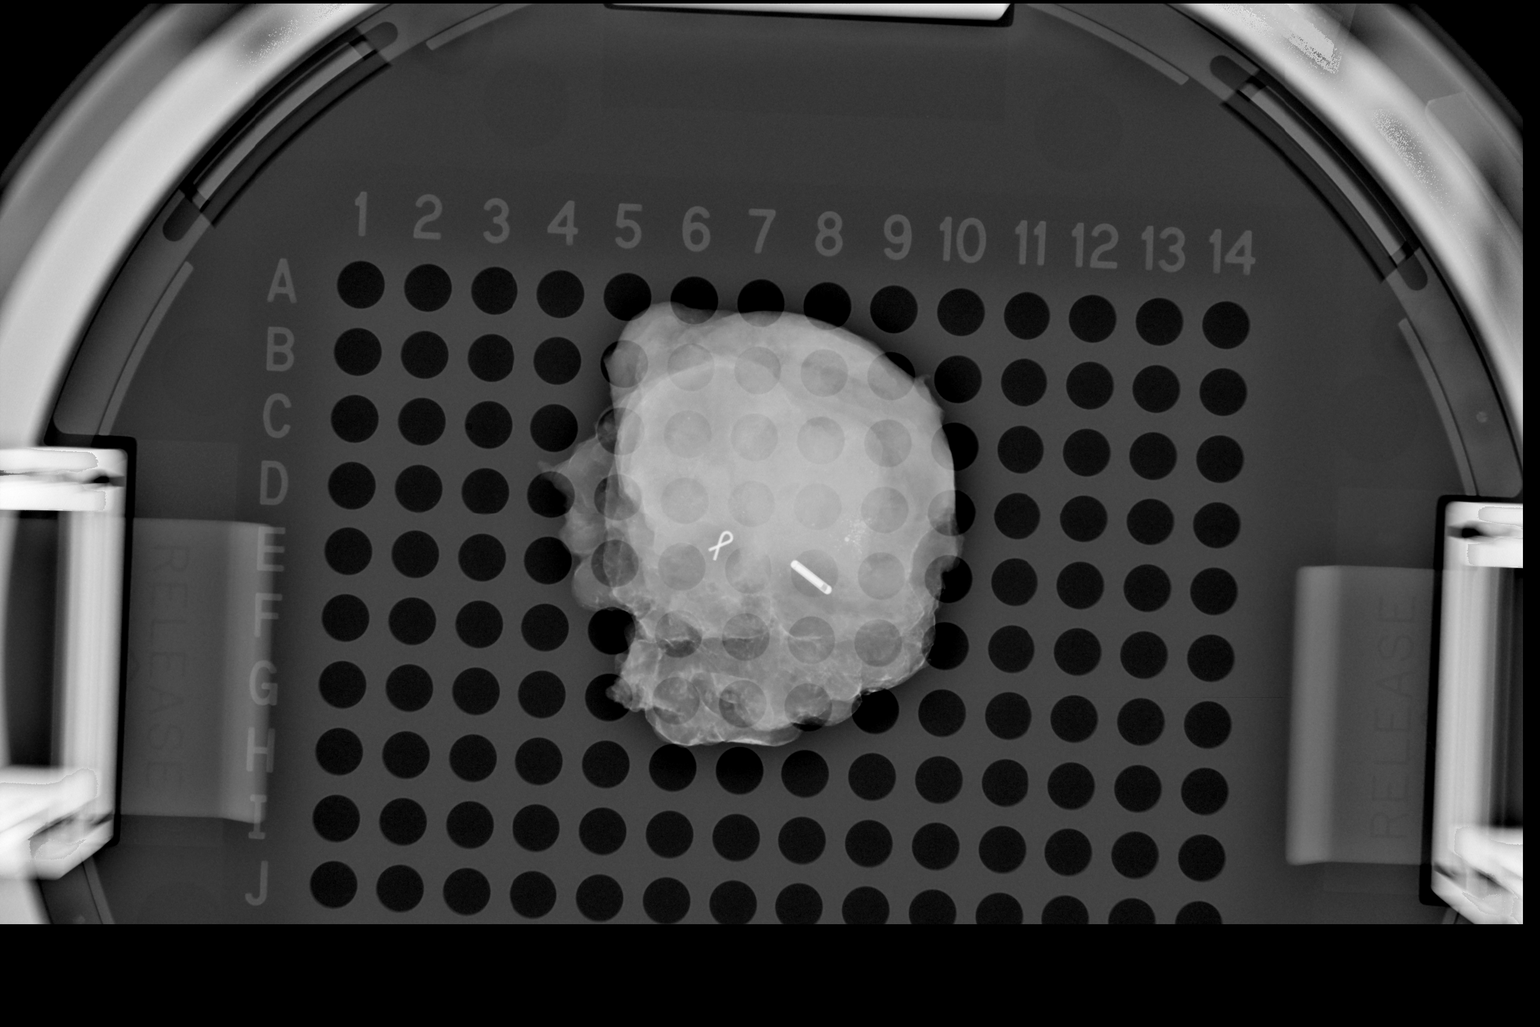

[1 of 1 positions shown; findings below may reference images not displayed]

FINDINGS: Status post excision of the left breast. The radioactive seed and
biopsy marker clip are present, completely intact, and were marked
for pathology.
IMPRESSION: Specimen radiograph of the left breast.

## 2019-08-14 ENCOUNTER — Other Ambulatory Visit: Payer: Self-pay | Admitting: Hematology and Oncology

## 2019-08-14 DIAGNOSIS — Z9889 Other specified postprocedural states: Secondary | ICD-10-CM

## 2019-08-14 DIAGNOSIS — Z853 Personal history of malignant neoplasm of breast: Secondary | ICD-10-CM

## 2019-08-20 NOTE — Progress Notes (Signed)
 Patient Care Team: Rankins, Victoria R, MD as PCP - General (Family Medicine) Byerly, Faera, MD as Consulting Physician (General Surgery) Gudena, Vinay, MD as Consulting Physician (Hematology and Oncology) Moody, John, MD as Consulting Physician (Radiation Oncology) Causey, Lindsey Cornetto, NP as Nurse Practitioner (Hematology and Oncology)  DIAGNOSIS:    ICD-10-CM   1. Post-menopausal  Z78.0 DG Bone Density  2. Malignant neoplasm of upper-inner quadrant of left breast in female, estrogen receptor positive (HCC)  C50.212    Z17.0     SUMMARY OF ONCOLOGIC HISTORY: Oncology History  Malignant neoplasm of upper-inner quadrant of left breast in female, estrogen receptor positive (HCC)  09/26/2016 Initial Diagnosis   Screening detected distortion in the left breast retroareolar region at 9:30 position 1.2 cm, axilla negative. Biopsy revealed grade 1 invasive ductal carcinoma with DCIS ER 95%, PR 40%, HER-2 negative ratio 0.84, Ki-67 3%, T1c N0 stage IA AJCC 8   10/16/2016 Surgery   Left lumpectomy: IDC grade 1, 1.2 cm, DCIS with necrosis, margins negative, 0/2 lymph nodes negative, ER 95%, PR 40%, HER-2 negative ratio 0.84, Ki-67 3%, T1 CN 0 stage IA   11/26/2016 - 01/11/2017 Radiation Therapy   Adjuvant radiation therapy   02/2017 -  Anti-estrogen oral therapy   Letrozole 2.5 mg daily changed to 1/2 tab daily from 08/20/17 due to fatigue and Inc cholesterol     CHIEF COMPLIANT: Follow-up of left breast cancer on letrozole therapy  INTERVAL HISTORY: Grace Cunningham is a 59 y.o. with above-mentioned history of left breast cancer treated with lumpectomy, radiation, and who is currently on letrozole therapy. Mammogram on 09/15/18 showed no evidence of malignancy bilaterally. She presents to the clinic today for follow-up.  She is tolerating half a tablet of letrozole fairly well.  She continues to have occasional aches and pains but she is able to manage them.  Denies any lumps or nodules in  the breast.  ALLERGIES:  has No Known Allergies.  MEDICATIONS:  Current Outpatient Medications  Medication Sig Dispense Refill  . letrozole (FEMARA) 2.5 MG tablet Take 0.5 tablets (1.25 mg total) by mouth daily. 90 tablet 3  . Melatonin 1 MG CAPS Take 3 capsules (3 mg total) by mouth daily.    . Multiple Vitamin (MULTIVITAMIN) tablet Take 1 tablet by mouth as needed.    . sertraline (ZOLOFT) 100 MG tablet Take 1 tablet (100 mg total) by mouth daily.    . traZODone (DESYREL) 50 MG tablet Take 1 tablet (50 mg total) by mouth at bedtime.     No current facility-administered medications for this visit.    PHYSICAL EXAMINATION: ECOG PERFORMANCE STATUS: 1 - Symptomatic but completely ambulatory  Vitals:   08/21/19 1032  BP: 107/69  Pulse: 65  Resp: 17  Temp: 99.1 F (37.3 C)  SpO2: 100%   Filed Weights   08/21/19 1032  Weight: 126 lb (57.2 kg)    BREAST: No palpable masses or nodules in either right or left breasts. No palpable axillary supraclavicular or infraclavicular adenopathy no breast tenderness or nipple discharge. (exam performed in the presence of a chaperone)  LABORATORY DATA:  I have reviewed the data as listed CMP Latest Ref Rng & Units 08/26/2018 10/03/2016  Glucose 70 - 99 mg/dL 82 77  BUN 6 - 20 mg/dL 19 13.6  Creatinine 0.44 - 1.00 mg/dL 0.80 0.7  Sodium 135 - 145 mmol/L 140 141  Potassium 3.5 - 5.1 mmol/L 4.2 4.0  Chloride 98 - 111 mmol/L 103 -    CO2 22 - 32 mmol/L 27 28  Calcium 8.9 - 10.3 mg/dL 9.3 9.6  Total Protein 6.5 - 8.1 g/dL 7.2 7.6  Total Bilirubin 0.3 - 1.2 mg/dL 0.2(L) 0.36  Alkaline Phos 38 - 126 U/L 108 94  AST 15 - 41 U/L 34 25  ALT 0 - 44 U/L 41 18    Lab Results  Component Value Date   WBC 5.6 08/26/2018   HGB 13.1 08/26/2018   HCT 42.0 08/26/2018   MCV 86.8 08/26/2018   PLT 206 08/26/2018   NEUTROABS 3.6 08/26/2018    ASSESSMENT & PLAN:  Malignant neoplasm of upper-inner quadrant of left breast in female, estrogen receptor  positive (HCC) 10/02/2018Left lumpectomy: IDC grade 1, 1.2 cm, DCIS with necrosis, margins negative, 0/2 lymph nodes negative, ER 95%, PR 40%, HER-2 negative ratio 0.84, Ki-67 3%, T1 CN 0 stage IA;insufficient tumor to do Oncotype testing Adjuvant radiation 11/26/2016-01/11/2017  Treatment plan: Adjuvant antiestrogen therapywith letrozole 2.5 mg daily (half tablet daily) Letrozole Toxicities:Severe fatigue and hot flashes. We discussed different options and decided that she could take half a tablet of letrozole daily.  High cholesterol: Stable  Breast cancer surveillance: 1. Breast exam 08/21/2019: Benign 2. Mammogram  09/15/2018: Benign, breast density category C  Return to clinic in 1 year for follow-up    Orders Placed This Encounter  Procedures  . DG Bone Density    Standing Status:   Future    Standing Expiration Date:   08/20/2020    Scheduling Instructions:     Please schedule it along with her mammograms on 09/17/19    Order Specific Question:   Reason for Exam (SYMPTOM  OR DIAGNOSIS REQUIRED)    Answer:   Post menopausal    Order Specific Question:   Is the patient pregnant?    Answer:   No    Order Specific Question:   Preferred imaging location?    Answer:   [facility]    Order Specific Question:   Release to patient    Answer:   Immediate   The patient has a good understanding of the overall plan. she agrees with it. she will call with any problems that may develop before the next visit here.  Total time spent: 20 mins including face to face time and time spent for planning, charting and coordination of care  Gudena, Vinay, MD 08/21/2019  I, Molly Dorshimer, am acting as scribe for Dr. Vinay Gudena.  I have reviewed the above documentation for accuracy and completeness, and I agree with the above.       

## 2019-08-21 ENCOUNTER — Inpatient Hospital Stay: Payer: BC Managed Care – PPO | Attending: Hematology and Oncology | Admitting: Hematology and Oncology

## 2019-08-21 ENCOUNTER — Other Ambulatory Visit: Payer: Self-pay

## 2019-08-21 VITALS — BP 107/69 | HR 65 | Temp 99.1°F | Resp 17 | Ht 64.0 in | Wt 126.0 lb

## 2019-08-21 DIAGNOSIS — R5383 Other fatigue: Secondary | ICD-10-CM | POA: Insufficient documentation

## 2019-08-21 DIAGNOSIS — Z79811 Long term (current) use of aromatase inhibitors: Secondary | ICD-10-CM | POA: Insufficient documentation

## 2019-08-21 DIAGNOSIS — C50212 Malignant neoplasm of upper-inner quadrant of left female breast: Secondary | ICD-10-CM | POA: Insufficient documentation

## 2019-08-21 DIAGNOSIS — Z170 Estrogen receptor positive status [ER+]: Secondary | ICD-10-CM | POA: Diagnosis not present

## 2019-08-21 DIAGNOSIS — R232 Flushing: Secondary | ICD-10-CM | POA: Diagnosis not present

## 2019-08-21 DIAGNOSIS — Z780 Asymptomatic menopausal state: Secondary | ICD-10-CM

## 2019-08-21 DIAGNOSIS — Z79899 Other long term (current) drug therapy: Secondary | ICD-10-CM | POA: Insufficient documentation

## 2019-08-21 DIAGNOSIS — E7800 Pure hypercholesterolemia, unspecified: Secondary | ICD-10-CM | POA: Diagnosis not present

## 2019-08-21 MED ORDER — LETROZOLE 2.5 MG PO TABS: 1.2500 mg | ORAL_TABLET | Freq: Every day | ORAL | 3 refills | Status: DC

## 2019-08-21 MED ORDER — MELATONIN 1 MG PO CAPS: 3.0000 | ORAL_CAPSULE | Freq: Every day | ORAL | Status: AC

## 2019-08-21 MED ORDER — SERTRALINE HCL 100 MG PO TABS: 100.0000 mg | ORAL_TABLET | Freq: Every day | ORAL | Status: AC

## 2019-08-21 NOTE — Assessment & Plan Note (Signed)
10/02/2018Left lumpectomy: IDC grade 1, 1.2 cm, DCIS with necrosis, margins negative, 0/2 lymph nodes negative, ER 95%, PR 40%, HER-2 negative ratio 0.84, Ki-67 3%, T1 CN 0 stage IA;insufficient tumor to do Oncotype testing Adjuvant radiation 11/26/2016-01/11/2017  Treatment plan: Adjuvant antiestrogen therapywith letrozole 2.5 mg daily (half tablet daily) Letrozole Toxicities:Severe fatigue and hot flashes. We discussed different options and decided that she could take half a tablet of letrozole daily.  High cholesterol: took statins for 6 weeks. Improved. Now on intermittent fasting Breast cancer surveillance: 1. Breast exam 08/21/2019: Benign 2. Mammogram  09/15/2018: Benign, breast density category C Return to clinic in 1 year for follow-up

## 2019-09-01 ENCOUNTER — Encounter: Payer: Self-pay | Admitting: Hematology and Oncology

## 2019-09-17 ENCOUNTER — Other Ambulatory Visit: Payer: Self-pay

## 2019-09-17 ENCOUNTER — Ambulatory Visit
Admission: RE | Admit: 2019-09-17 | Discharge: 2019-09-17 | Disposition: A | Discharge status: Home / Self Care | Payer: BC Managed Care – PPO | Source: Ambulatory Visit | Attending: Hematology and Oncology | Admitting: Hematology and Oncology

## 2019-09-17 DIAGNOSIS — Z853 Personal history of malignant neoplasm of breast: Secondary | ICD-10-CM

## 2019-09-17 DIAGNOSIS — Z9889 Other specified postprocedural states: Secondary | ICD-10-CM

## 2019-12-08 ENCOUNTER — Ambulatory Visit
Admission: RE | Admit: 2019-12-08 | Discharge: 2019-12-08 | Disposition: A | Discharge status: Home / Self Care | Payer: BC Managed Care – PPO | Source: Ambulatory Visit | Attending: Hematology and Oncology | Admitting: Hematology and Oncology

## 2019-12-08 ENCOUNTER — Other Ambulatory Visit: Payer: Self-pay

## 2019-12-08 DIAGNOSIS — Z780 Asymptomatic menopausal state: Secondary | ICD-10-CM

## 2019-12-15 ENCOUNTER — Encounter: Payer: Self-pay | Admitting: Hematology and Oncology

## 2020-02-14 ENCOUNTER — Other Ambulatory Visit: Payer: Self-pay | Admitting: Hematology and Oncology

## 2020-02-15 ENCOUNTER — Other Ambulatory Visit: Payer: Self-pay | Admitting: Hematology and Oncology

## 2020-02-16 MED ORDER — LETROZOLE 2.5 MG PO TABS
2.5000 mg | ORAL_TABLET | Freq: Every day | ORAL | 0 refills | Status: DC
Start: 2020-02-16 — End: 2020-07-20

## 2020-07-20 ENCOUNTER — Other Ambulatory Visit: Payer: Self-pay | Admitting: Hematology and Oncology

## 2020-08-22 NOTE — Assessment & Plan Note (Deleted)
10/02/2018Left lumpectomy: IDC grade 1, 1.2 cm, DCIS with necrosis, margins negative, 0/2 lymph nodes negative, ER 95%, PR 40%, HER-2 negative ratio 0.84, Ki-67 3%, T1 CN 0 stage IA;insufficient tumor to do Oncotype testing Adjuvant radiation 11/26/2016-01/11/2017  Treatment plan: Adjuvant antiestrogen therapywith letrozole 2.5 mg daily (half tablet daily) Letrozole Toxicities:Severe fatigue and hot flashes. We discussed different options and decided that she could take half a tablet of letrozole daily.  High cholesterol: Stable  Breast cancer surveillance: 1. Breast exam 08/23/2020: Benign 2. Mammogram 09/17/19: Benign, breast density category C Bone Density: T score -1.2  Return to clinic in 1 year for follow-up

## 2020-08-23 ENCOUNTER — Inpatient Hospital Stay: Payer: BC Managed Care – PPO | Attending: Hematology and Oncology | Admitting: Hematology and Oncology

## 2020-08-23 DIAGNOSIS — C50212 Malignant neoplasm of upper-inner quadrant of left female breast: Secondary | ICD-10-CM

## 2020-10-15 ENCOUNTER — Other Ambulatory Visit: Payer: Self-pay | Admitting: Hematology and Oncology
